# Patient Record
Sex: Female | Born: 1980 | Race: White | Hispanic: No | Marital: Single | Smoking: Never smoker
Health system: Southern US, Community
[De-identification: ages and names within clinical notes are randomized; demographics above are authoritative.]

## PROBLEM LIST (undated history)

## (undated) DIAGNOSIS — F445 Conversion disorder with seizures or convulsions: Secondary | ICD-10-CM

## (undated) DIAGNOSIS — Z974 Presence of external hearing-aid: Secondary | ICD-10-CM

## (undated) DIAGNOSIS — H9191 Unspecified hearing loss, right ear: Secondary | ICD-10-CM

## (undated) DIAGNOSIS — R569 Unspecified convulsions: Secondary | ICD-10-CM

---

## 2018-02-10 ENCOUNTER — Encounter: Payer: Self-pay | Admitting: NEUROLOGY

## 2018-02-10 ENCOUNTER — Ambulatory Visit: Payer: MEDICAID | Attending: NEUROLOGY | Admitting: NEUROLOGY

## 2018-02-10 VITALS — BP 119/87 | HR 86 | Temp 98.0°F | Resp 16 | Ht 62.0 in | Wt 120.4 lb

## 2018-02-10 DIAGNOSIS — G40309 Generalized idiopathic epilepsy and epileptic syndromes, not intractable, without status epilepticus: Principal | ICD-10-CM | POA: Insufficient documentation

## 2018-02-10 DIAGNOSIS — F410 Panic disorder [episodic paroxysmal anxiety] without agoraphobia: Secondary | ICD-10-CM | POA: Insufficient documentation

## 2018-02-10 NOTE — Nursing Note (Signed)
Vital signs taken, allergies verified, screened for pain.     Samiah Ricklefs, Avila Barron MA

## 2018-02-11 NOTE — Communication Body (Signed)
Lisa Hinton  [address]  [place] [address]    Dear Dr. Luis Abed, Lisa Gavia    Your patient, Lisa Hinton is a 37yr old female was seen today at the [facility] for possible facial pain trigeminal neuralgia. Marland Kitchen     HISTORY OF PRESENT ILLNESS:    The patient and her husband state that actually her problem is not trigeminal neuralgia but that prior referrals for pseudoseizures  had been previously turned down by Neurology at [facility] and [facility].    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  Neither she nor the spouse are aware as to whether this was sudden or not,  but the husband recalls that the patient had a lot of ear infections and was given speech training in school.  She was in special education because of her hearing deficit but did complete high school    5 years ago while under a lot of stress for legal issues contesting at which child custody she developed shaking kicks in her mouth and face went to the [facility] emergency room and had a workup that demonstrated a degenerative disc of her neck and EEG that was reportedly normal during the shaking episodes she slowly recovered with tearing on the left side of her face and weakness on the left side of her face using a walker she gradually recovered.    More recently in the past months in the setting of significant home stress with 4 children from a prior marriage,  she has episodes every 3 days where she developed facial tics that involve eye blinking twitching of the corner of her mouth confusion unable to walk with a recovery for brief episodes within hours and others that take a day or so her spouse notes that she complains occasionally of occipital headache sometimes this occurs during the  episodes sometimes this occurs after the episodes and she attributes it to neck pain.     Pt denies photophobia, phonophobia, N or V with occipital headaches-- denies visual scotomata     REVIEW OF SYSTEMS:  ROS    PAST MEDICAL HISTORY:   No past medical history on file.    MEDICATIONS:   No current outpatient medications on file prior to visit.     No current facility-administered medications on file prior to visit.        ALLERGIES:   No Known Allergies    FAMILY HISTORY:     EXAMINATION:     Neuro Exam:  Vitals:   BP 119/87 (SITE: left arm, Orthostatic Position: sitting, Cuff Size: regular)   Pulse 86   Temp 36.7 C (98 F) (Tympanic)   Resp 16   Ht 1.575 m ( )   Wt 54.6 kg (120 lb 5.9 oz)   BMI 22.02 kg/m     Mental Status:  Oriented to person and place,     Cranial Nerves:poor hearing using left ear and lip reading. Speech c/w early hearing loss, no facial weakness re eye lid closure, incomplete but symmetric smile    Gait:   Elaborated with knees together when walking but distractible to normal gait with slumped posture  general  tandem   Balance (ie Rhomberg, vestibular, cerebellar etc)     Coordination:  normal below  F-to-N:    H-to-S:   Rapid-alt movements    Reflexes: 2+, symmetric throughout    Motor (other)-- no tremor, no dystonia  Pyramidal   Extrapyramidal    Sensory: intact vibration and temperature of fork  Temperature:  Pinprick :   Vibration:      Motor:  Tone and bulk: thin, generalized deconditioning      DIAGNOSTIC STUDIES:     MRI brain 10/26/2017 ([place] [address]-- read only no CD, note patient had seizure during the MRI per the technologist): "few tiny nonspecific subcortical cerebral white matter changes bilaterally",  -- report uploaded into EMR-- no evidence of mass lesion or findings c/w demyelinative disease    MRI Cervical Spine 11/13/2017  ([place] [address] [phone])--  straightening  Of  cervical lordosis, mild broad-based posterior disk bulge  vs disc protusioon at C6-7- indents thecal sac with moderate central stenosisi  9mm  T2 signal intensity with superficial subcutaneous fat of posterior neck at C6 c/w sebaceous cyst    SUMMARY & IMPRESSION:     Pt had several events in the room consistent of twitching of left face, right corner of mouth, blinking of left, right or both eyes and trembling-- not simultaneously, no alteration of consciousness or tone    IMP: Non-epileptic seizures, no underlying neurological disorder detected on neurological examination    PLAN: Obtain CDs of MRI of head and neck for review at [facility] but radiological reads were thorough    A significant amount of time was spent re: NES and treatment options.        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     No orders of the defined types were placed in this encounter.    No further neurological evaluation necessary at this time but review of MRI imaging for completeness only    60  minutes were spent with patient, more than 50% of which was spent counseling and/or coordinating care on the disorder, diagnostic evaluation and treatment options.    Thank you very much for the referral    Sincerely yours      Electronically signed  Horald Pollen MD PhD  Professor and Chair  Department  of Neurology   [place]   Note: this chart was created in part with Dragon voice-recognition software. Every effort has been made to correct any errors in the voice-recognition / dictation, but some may have been missed. If there are any questions, please contact the author for clarification and revision if needed.

## 2018-02-11 NOTE — Progress Notes (Signed)
Lisa Hinton  [address]  [place]  [place] [address]    Dear Dr. Ahlers, Lisa Kinsella    Your patient, Lisa Hinton is a 37yr old female was seen today at the [facility] for possible facial pain trigeminal neuralgia. .     HISTORY OF PRESENT ILLNESS:    The patient and her husband state that actually her problem is not trigeminal neuralgia but that prior referrals for pseudoseizures  had been previously turned down by Neurology at [facility].    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  Neither she nor the spouse are aware as to whether this was sudden or not,  but the husband recalls that the patient had a lot of ear infections and was given speech training in school.  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    More recently in the past months in the setting of significant home stress with 4 children from a prior marriage,  she has episodes every 3 days where she developed facial tics that involve eye blinking twitching of the corner of her mouth confusion unable to walk with a recovery for brief episodes within hours and others that take a day or so her spouse notes that she complains occasionally of occipital headache sometimes this occurs during the  episodes sometimes this occurs after the episodes and she attributes it to neck pain.     Pt denies photophobia, phonophobia, N or V with occipital headaches-- denies visual scotomata     REVIEW OF SYSTEMS:  ROS    PAST MEDICAL HISTORY:   No past medical history on file.    MEDICATIONS:   No current outpatient medications on file prior to visit.     No current facility-administered medications on file prior to visit.        ALLERGIES:   No Known Allergies    FAMILY HISTORY:     EXAMINATION:     Neuro Exam:  Vitals:   BP 119/87 (SITE: left arm, Orthostatic Position: sitting, Cuff Size: regular)   Pulse 86   Temp 36.7 C (98 F) (Tympanic)   Resp 16   Ht 1.575 m (5' 2")   Wt 54.6 kg (120 lb 5.9 oz)   BMI 22.02 kg/m     Mental Status:  Oriented to person and place,     Cranial Nerves:poor hearing using left ear and lip reading. Speech c/w early hearing loss, no facial weakness re eye lid closure, incomplete but symmetric smile    Gait:   Elaborated with knees together when walking but distractible to normal gait with slumped posture  general  tandem   Balance (ie Rhomberg, vestibular, cerebellar etc)     Coordination:   normal below  F-to-N:    H-to-S:   Rapid-alt movements    Reflexes: 2+, symmetric throughout    Motor (other)-- no tremor, no dystonia  Pyramidal   Extrapyramidal    Sensory: intact vibration and temperature of fork  Temperature:  Pinprick :   Vibration:      Motor:  Tone and bulk: thin, generalized deconditioning      DIAGNOSTIC STUDIES:     MRI brain 10/26/2017 ([place] [address]-- read only no CD, note patient had seizure during the MRI per the technologist): "few tiny nonspecific subcortical cerebral white matter changes bilaterally",  -- report uploaded into EMR-- no evidence of mass lesion or findings c/w demyelinative disease    MRI Cervical Spine 11/13/2017  ([place] [address] [phone])--  straightening  Of  cervical lordosis, mild broad-based posterior disk bulge  vs disc protusioon at C6-7- indents thecal sac with moderate central stenosisi  9mm  T2 signal intensity with superficial subcutaneous fat of posterior neck at C6 c/w sebaceous cyst    SUMMARY & IMPRESSION:     Pt had several events in the room consistent of twitching of left face, right corner of mouth, blinking of left, right or both eyes and trembling-- not simultaneously, no alteration of consciousness or tone    IMP: Non-epileptic seizures, no underlying neurological disorder detected on neurological examination    PLAN: Obtain CDs of MRI of head and neck for review at [facility] but radiological reads were thorough    A significant amount of time was spent re: NES and treatment options.        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     No orders of the defined types were placed in this encounter.    No further neurological evaluation necessary at this time but review of MRI imaging for completeness only    60  minutes were spent with patient, more than 50% of which was spent counseling and/or coordinating care on the disorder, diagnostic evaluation and treatment options.    Thank you very much for the referral    Sincerely yours      Electronically signed  Horald Pollen MD PhD  Professor and Chair  Department  of Neurology   [place]   Note: this chart was created in part with Dragon voice-recognition software. Every effort has been made to correct any errors in the voice-recognition / dictation, but some may have been missed. If there are any questions, please contact the author for clarification and revision if needed.

## 2018-06-18 ENCOUNTER — Encounter: Payer: Self-pay | Admitting: NEUROLOGY

## 2018-06-18 NOTE — Progress Notes (Signed)
Contacted patient for scheduling, per husband Lisa Hinton patient is already being seen with another neurologist and doesn't not want to schedule any upcoming appointments.          Laury Axon  Neuroscience [facility]  Appointment line:  [phone]  Direct line: [phone]

## 2018-08-09 ENCOUNTER — Inpatient Hospital Stay
Admission: RE | Admit: 2018-08-09 | Discharge: 2018-08-11 | DRG: 880 | Disposition: A | Discharge status: Home / Self Care | Payer: MEDICAID | Source: Ambulatory Visit | Attending: CLN NEUROPHYSIOLOGY | Admitting: CLN NEUROPHYSIOLOGY

## 2018-08-09 DIAGNOSIS — G43909 Migraine, unspecified, not intractable, without status migrainosus: Secondary | ICD-10-CM | POA: Diagnosis present

## 2018-08-09 DIAGNOSIS — R253 Fasciculation: Secondary | ICD-10-CM

## 2018-08-09 DIAGNOSIS — R250 Abnormal head movements: Secondary | ICD-10-CM

## 2018-08-09 DIAGNOSIS — R258 Other abnormal involuntary movements: Secondary | ICD-10-CM

## 2018-08-09 DIAGNOSIS — F445 Conversion disorder with seizures or convulsions: Principal | ICD-10-CM | POA: Diagnosis present

## 2018-08-09 DIAGNOSIS — Z79899 Other long term (current) drug therapy: Secondary | ICD-10-CM

## 2018-08-09 DIAGNOSIS — H908 Mixed conductive and sensorineural hearing loss, unspecified: Secondary | ICD-10-CM | POA: Diagnosis present

## 2018-08-09 DIAGNOSIS — Z87898 Personal history of other specified conditions: Secondary | ICD-10-CM

## 2018-08-09 DIAGNOSIS — R6889 Other general symptoms and signs: Secondary | ICD-10-CM

## 2018-08-09 MED ORDER — AMITRIPTYLINE 10 MG TABLET
10.0000 mg | ORAL_TABLET | Freq: Every day | ORAL | Status: DC
Start: 2018-08-10 — End: 2018-08-09
  Filled 2018-08-09: qty 1, fill #0

## 2018-08-09 MED ORDER — DOCUSATE SODIUM 100 MG CAPSULE
100.0000 mg | ORAL_CAPSULE | Freq: Two times a day (BID) | ORAL | Status: DC
Start: 2018-08-09 — End: 2018-08-11

## 2018-08-09 MED ORDER — ENOXAPARIN 40 MG/0.4 ML SUBCUTANEOUS SYRINGE
40.0000 mg | INJECTION | Freq: Every day | SUBCUTANEOUS | Status: DC
Start: 2018-08-09 — End: 2018-08-11
  Administered 2018-08-09 – 2018-08-10 (×2): 40 mg via SUBCUTANEOUS
  Filled 2018-08-09 (×2): qty 0.4, fill #0

## 2018-08-09 MED ORDER — ACETAMINOPHEN 325 MG TABLET
650.0000 mg | ORAL_TABLET | ORAL | Status: DC | PRN
Start: 2018-08-09 — End: 2018-08-09

## 2018-08-09 MED ORDER — ACETAMINOPHEN 325 MG TABLET
650.0000 mg | ORAL_TABLET | ORAL | Status: DC | PRN
Start: 2018-08-09 — End: 2018-08-11
  Administered 2018-08-09 – 2018-08-10 (×4): 650 mg via ORAL
  Filled 2018-08-09 (×4): qty 2, fill #0

## 2018-08-09 MED ORDER — AMITRIPTYLINE 10 MG TABLET
20.0000 mg | ORAL_TABLET | Freq: Every day | ORAL | Status: DC
Start: 2018-08-10 — End: 2018-08-11
  Administered 2018-08-10: 20 mg via ORAL
  Filled 2018-08-09 (×2): qty 2, fill #0

## 2018-08-09 MED ORDER — LORAZEPAM 2 MG/ML INJECTION SOLUTION
2.0000 mg | INTRAMUSCULAR | Status: DC | PRN
Start: 2018-08-09 — End: 2018-08-11

## 2018-08-09 NOTE — Nurse Focus (Signed)
2232 H : Seizure alarm on and upon entering room, pt positioned to the R with neck extended and noted some R facial twitching, eye fluttering w/ eyes rolled up, hand - fingers twitching R > L and bilateral feet - toes twitching R > L. HR on the 80's, satting great at 98-99% on RA.  2235 H : Noted hand twitching more pronounced R > L side. No incontinence during the episode. During this event, pt was non verbal but pt able to follow some commands like hand grasp, showing 2 fingers, thumbs up. Episode prob lasted 12-15 mins.  2246 H : Event ended. Post event, pt states she's very tired, out for words, HR 80's, sating great at 98% on RA. 2300'ish - pt appears to be back to baseline, conversant. Continue w/ seizure prec, supplemental O2 via FM w/ RB adm PRN, suction ready - standby. Will cont to monitor.    2248 H : Pt again w/ neck extended started to have eye fluttering, able to tell and point to RN  to her tongue rolled  Back. Some eye fluttering, fingers twitching R > L, feet - toes twitching as well R > L . Pt able to lift R arm up to command, hand grasp. HR 90 bpm sating great at 99% on RA. Episode ended at approx 2257 H. Post event, pt states she's not able to verbally respond but pt able tp point out that her neck hurts. 2310'ish - pt more conversant, appears to be back to  Baseline. Will cont to monitor.    NRO notified w/ this events - no new orders received. Will cont to monitor.    2351 H: RN at [facility] and noted feet - toes twitching and R hand - fingers twitching - seizure alarm pushed. Pt alert and conversant, HR 80's sating great at 99% on RA. Pt able to wipe her face and cary on conversation during this event. Episode prob lasted 2-3 mins.  Will cont to monitor. Continue w/ seizure prec.      Anson Fret, RN

## 2018-08-09 NOTE — H&P (Addendum)
NEUROLOGY RESIDENT PGY-2 VET ADMISSION H&P    PATIENT:  Lisa Hinton  MRN:         [id number]    NOTE DATE / TIME:  08/09/2018  @ 16:33    CHIEF COMPLAINT:  Patient is directly admitted for video EEG telemetry monitoring.    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 She still has balance issues from the stroke she says she had. 3 weeks after, she notes that she started to have seizures. She would have random episodes of left face tingling +/- left hand tingling followed by bilateral face twitching L>R, bilateral eyelid twitching, eyes roll upwards, bilateral hand twitching, and bilateral feet twitching. This would be followed by 2 days of fatigue and word finding difficulty. Other than word finding difficulty, no confusion. No urinary incontinence of tongue biting, although she notes that recent episodes her tongue will roll upwards. Head does not turn, she does not stiffen. She is unsure if she has ever had violent convulsions before, but there were times were her husband needed to hold her down due to fear of falling. She knows her episodes are about to come when she has tingling in the left side of her face. She started taking Keppra 750mg  BID per tele neurologist, but since then her episodes have become more frequent, and she notices that after about 1 hour after taking dose she has an episode. Seizure trigger includes bright lights and spicy foods. Otherwise she does not notice any other triggers or patterns to her seizure.  No nausea or vomiting with her symptoms, or visual hallucinations. Right now, she has several episodes a day, too many to count. Each episode can last anywhere from 1 minute to 1 whole hour. Sometimes the seizure would awaken her from her sleep. Sometimes they would happen in the middle of the night while she is awake.    She has no history of head trauma. She reports that she was born preterm, about 6 weeks early. 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 She had a rEEG performed in the past, unsure of results. 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 She also reports that her CMV and EBV viral levels are very high (per OSH records, she is referring to IgG elevated titers which are expected to be elevated in someone with prior infectious mononucleosis and prior CMV infection).    She reports she has a history of migraine headaches, gets migraines about 3x a week, no auras associated with migraine, takes advil which helps. Recently started amitriptiline. Sometimes her episodes follow her migraines, but her episodes happen most of the time without migraines.    She has never been on AEDs other than Keppra which was started in  August.    She denies any stressors in her life.    Per OSH hospital records, she had normal routine EEG study 6 years ago, normalcy of awake and drowsy EEG performed 04/29/2018. Told outside provider having 12 episodes a week, put on Keppra 750mg  BID as a trial.      REVIEW OF SYSTEMS:  A ROS was performed and is negative except for those items noted in the HPI    PAST MEDICAL & SURGICAL HISTORY:  Migraines  Whole body cysts  Trigeminal neuralgia  High CMV levels  High EBV levels    MEDICATIONS, OUTPATIENT:  Elavil 20mg  daily morning  Keppra 750mg  BID     ALLERGIES:  Gets  yeast infections from antibiotics    FAMILY HISTORY:  4 children, eldest 53 years old, all healthy, no history of neurological disorders  Parents healthy, no history of neurological disorders  Has brothers and sisters, no history of neurological disorders    SOCIAL HISTORY:   No smoking alcohol or drugs  Lives with her husband and 4 kids  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  Does patient drive?:  Yes, has active license  Special education classes whole life but has high school degree    EXAMINATION:    Weight:       Vitals:     Current  Minimum Maximum   BP    BP: --   Temp    No Data Recorded   No Data Recorded     Pulse   No Data Recorded   No Data Recorded     Resp   No Data Recorded   No Data Recorded     O2 Sat   No Data Recorded  No Data Recorded    O2 Deliv None ; No Data Recorded      24 Hour Intake/Output:  No intake/output data recorded.    General Physical Exam:  General:  Well appearing female, lying in bed, anxious appearing  HEENT:  NCAT, EEG leads on  Respiratory: No distress  Skin:  No rash    Neuro Exam:    Mental Status:    State:  Alert and awake, socially appropriate, slightly anxious, tangential  Use of language:  Appropriate, no receptive or expressive difficulties  Speech: Fluent and coherent without paraphrases    Cranial Nerves:  CN 2:        PERRL, VF intact to finger count  CN 3,4,6:  EOMI without nystagmus or abnormal saccades.  CN 5:        Sensation to LT intact.  CN 7:        Facial symmetric.  CN 8:        Hearing intact to loud voice.  CN 9, 10:  Palate elevation symmetric  CN 11:     Trapezius strength 5/5 bilaterally.  CN 12:      Tongue protrusion midline.    Motor:  Tone and bulk:  Normal    Pronator drift:  None      Deltoid Biceps Triceps Wrist Flexors Wrist Extensors Grip   R 5 5 5 5 5 5    L 5 5 5 5 5 5       Hip Flexors Leg Abduct Knee Flexors Knee Ext Dorsi- Flexion Plantar- Flexion   R 5 5 5 5 5 5    L 5 5 5 5 5 5      Reflexes:     Biceps-C5 BR-C6 Triceps-C7 Patellar-L4  Achilles-S1 Plantar  Resp   R 2+ 2+ 2+ 2+ 2+ down   L 2+ 2+ 2+ 2+ 2+ down     Sensory:  Light touch: intact throughout    Coordination:  F-to-N:  No ataxia    General gait:  Normal    DIAGNOSTIC STUDIES:    EEG:   rEEG 04/29/2018  This recording is severely technically limited secondary to frequent motion, muscle, and eye flutter artifacts. Readable segments demonstrate a posterior dominant rhythm in the 9-10 hz range, which is symmetric. Low amplitude beta activity is seen in the anterior and central head region symmetrically. Drowsiness and other sleep stages not appreciated. Hyperventilation and photic stimulation were not performed. This routine EEG is severely technically limited secondary to very frequent motion, muscle, and eye flutter artifact. Readable segments were WNL. No definite focal nor irritative features were seen. If clinically indicated, we would repeat study under better conditions.    MRI:    MRI Cervical Spine without Contrast 11/13/2017  There is straightening of the cervical lordosis. There is a mild broad-based posterior disc bulge versus disc protrusion at C6-7. This indents the ventral aspect of the thecal asac and reuslts in moderate central stenosis as it combines with straightening of the cervical lordosis. There is moderate central stenosis from C3-4 through C6-7 as described above and mild central stenosis at C2-3. There is an approximate 9mm increased T2 signal intensity within the superficial subcutaneous fat at the poster neck at the level of C6. The exact etiology of this is unclear although this may be related to sebaceous cyst. Clinical correlation is needed.    MRI Brain without Contrast 04/25/2018  There are a few tiny nonspecific subcortical cerebral white matter changes bilaterally. These do not have a characteristic appearance or distribution to suggest a particular etiology. They are abnormal for age. Possible causes include vasculitis and Lyme disease. Demyelinating  disease is not entirely excluded given patient's age. No apparent intracranial mass is seen within the limits of a noncontrast exam.    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    PLAN:   1.  Admit to Neurology for video EEG telemetry monitoring.  2.  Plan to decrease current AEDs:  Discontinue Keppra  3.  Seizure precautions / padded bed rails.  4.  Door to room is to always remain open, and sitter must be present 24 hours a day.  5.  Continuous pulse oximetry with alarm set at 85% saturation or less.   6.  RN to notify team for generalized convulsive seizure lasting more than 2 minutes,        two or more convulsive seizures within a 60 minute period, or two or more seizures without complete recovery of consciousness beween seizures.  7.  Ativan 1mg  is to be given for generalized convulsive seizures lasting more than 2 minutes, two or more generalized convulsive seizures within any 8 hour period, or three or more seizures of any duration accompanied by alteration of consciousness in any 12 hour period.    F/E/N:  Regular Diet  DVT/GI PROPHYLAXIS:  Lovenox  CODE STATUS:  Full    PRESENT ON ADMISSION:  Are any of the following five conditions present or suspected on admission: decubitus ulcer, infection from an intravascular device, infection due to an indwelling catheter, surgical site infection, or pneumonia? No.  Report Electronically Signed By:  Fernand Parkins, DO (Resident)  Department of Neurology, Pager: [phone]      ATTENDING ADDENDUM:  The patient was seen and evaluated with Dr. Cherly Hensen.  I reviewed and agree with the resident's assessment and plan we developed as outlined in the note with the exception of any addendum noted below:    #1 Spells of abnormal shaking of unclear etiology.   Ms. Storey is being admitted for spell  classification She has had spells for several years of bilateral abnormal movement with retained awareness. She was placed on Keppra by her prior neurologist as a trial but noticed that this did not help her spells. She did not take her Keppra this morning. We will stop Keppra today to try to induce spells. Additionally, she can try spicy food and sleep deprivation to trigger events. As these spells occur multiple times per day, we will try to monitor her for several typical spells. She has no history of a convulsive seizure. . Suspicion is high that these events are nonepileptic spells.    Wynetta Fines, M.D.  Assistant Clinical Professor  Department of Neurology - Epilepsy

## 2018-08-09 NOTE — Nurse Assessment (Signed)
ASSESSMENT NOTE    Note Started: 08/09/2018, 23:32     Initial assessment completed and recorded in EMR.  Report received from day shift nurse and orders reviewed. Plan of Care reviewed and updated, discussed with patient and family.    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    2000: Pt appears sleepy this time. O x 4 but speech is slow and somehow some word finding difficulty. Per Fredia Sorrow who is at [facility] states that "this is typical of her after events, she's tired and slow and out for words". PERRLA, grossly tracks, FS, TM. With gen weakness, MAE x 4, tolerates OOb to BR w/ 1 person assist and voids spont to yellow urine. Had a BM. Tolerates diet. Pt able to reposition self in bed.    On tele, checked/w/ D6 tele tech Thayer Ohm and pt appears to be on SR.     On EEG, all EEG leads appear intact, monitors on, alarms on and audible. Suction and O2 ready - standby. Checked w/ on call EEG lab tech that pt is not on Co2 monitor - states she'll be connected in the morning.    Mom Renea Ee at [facility] as sitter, attentive to pt.    Reinforced seizure prec and protocol.    Pt assisted in needs. Will cont to monitor.    Anson Fret, RN

## 2018-08-09 NOTE — Nurse Assessment (Signed)
ADMIT NURSING NOTE    Note Started: 08/09/2018, 19:24     Patient admitted at 1633 hours EEGlab and accompanied by EEG tech and pt's parents. Pt condition stable . patient and family oriented to room and unit. Admission Assessment and Plan of Care initiated.  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    Pt A&Ox4, mother at bedside, pt connected to tele and EEG monitoring, PIV inserted in Left AC. Pt ate dinner. 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    Pt had 2 events during which she had bilateral eyelid twitching/flattering, bilateral hand and feet twitching, lip smacking, no convulsions or stiffening, responsive with word finding difficulties.    Reports given to night shift nurse.     Lilly Cove,  RN

## 2018-08-10 ENCOUNTER — Telehealth: Payer: Self-pay | Admitting: CLN NEUROPHYSIOLOGY

## 2018-08-10 DIAGNOSIS — G43909 Migraine, unspecified, not intractable, without status migrainosus: Secondary | ICD-10-CM

## 2018-08-10 DIAGNOSIS — H908 Mixed conductive and sensorineural hearing loss, unspecified: Secondary | ICD-10-CM

## 2018-08-10 LAB — C DIFFICILE SURVEILLANCE TEST: TEST RESULT 2: NEGATIVE

## 2018-08-10 LAB — CULTURE SURVEILLANCE, MRSA

## 2018-08-10 MED ORDER — CYCLOBENZAPRINE 10 MG TABLET
5.0000 mg | ORAL_TABLET | Freq: Three times a day (TID) | ORAL | Status: DC | PRN
Start: 2018-08-10 — End: 2018-08-11
  Administered 2018-08-10: 5 mg via ORAL
  Filled 2018-08-10: qty 1, fill #0

## 2018-08-10 MED ORDER — IBUPROFEN 400 MG TABLET
400.0000 mg | ORAL_TABLET | Freq: Four times a day (QID) | ORAL | Status: DC | PRN
Start: 2018-08-10 — End: 2018-08-11
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
## 2018-08-10 NOTE — Nurse Assessment (Signed)
ASSESSMENT NOTE    Note Started: 08/10/2018, 23:36     Initial assessment completed and recorded in EMR.  Report received from day shift nurse and orders reviewed. Pt AAOx4, video EEG in use, telemetry monitor in place, VSS, family at bedside. Alarms active and audible. Plan of Care reviewed and appropriate, discussed with patient and family.  Vonna Kotyk, RN

## 2018-08-10 NOTE — Plan of Care (Signed)
Problem: Patient Care Overview  Goal: Plan of Care Review  Outcome: Ongoing (interventions implemented as appropriate)  Flowsheets (Taken 08/10/2018 0432)  Outcome Summary: No neuro changes from baseline. VSS. SR on tele. Medicated 1x w/ po tylenol w/ adeq relief. Up to BR w/ 1 person assist, regular B/B. Pt able to reposition self in bed. Has some events during the night - pls see focus note. NRO notified of events - no new orders received. All EEG leads appear intact, monitors on, alarms on and audible. Mom Renea Ee at [facility] and stayed overnight - attentive to pt. Seizure prec maintained. Sleeping in b/n rounds / checks. Pt free from injury.  Goal: Individualization and Mutuality  Outcome: Ongoing (interventions implemented as appropriate)  Goal: Discharge Needs Assessment  Outcome: Ongoing (interventions implemented as appropriate)  Goal: Interprofessional Rounds/Family Conf  Outcome: Ongoing (interventions implemented as appropriate)     Problem: Fall Risk (Adult)  Goal: Identify Related Risk Factors and Signs and Symptoms  Description  Related risk factors and signs and symptoms are identified upon initiation of Human Response Clinical Practice Guideline (CPG).  Outcome: Ongoing (interventions implemented as appropriate)  Goal: Absence of Fall  Description  Patient will demonstrate the desired outcomes by discharge/transition of care.  Outcome: Ongoing (interventions implemented as appropriate)     Problem: Seizure Disorder/Epilepsy (Adult)  Goal: Signs and Symptoms of Listed Potential Problems Will be Absent, Minimized or Managed (Seizure Disorder/Epilepsy)  Description  Signs and symptoms of listed potential problems will be absent, minimized or managed by discharge/transition of care (reference Seizure Disorder/Epilepsy (Adult) CPG).  Outcome: Ongoing (interventions implemented as appropriate)

## 2018-08-10 NOTE — Nurse Assessment (Signed)
ASSESSMENT NOTE    Note Started: 08/10/2018, 10:36     Initial assessment completed and recorded in EMR.  Report received from night shift nurse and orders reviewed. Plan of Care reviewed and appropriate, discussed with patient. Pt oriented x 4, moves all extremities, family at bedside, ate with good appetite, food tolerated well,  Elisha Headland, RN RN

## 2018-08-10 NOTE — Telephone Encounter (Signed)
Received a call from the patient's spouse requesting a call back to discuss patient's VET admission and diagnosis.         Ashok Cordia Luca Dyar   Neurophysiology [facility]  Phone: [phone]

## 2018-08-10 NOTE — Plan of Care (Addendum)
Pt neurostatus remain stable, pt up and walk to the BR with supervision, with steady gait. , ate with good appetite, food tolerated well,  EEG lead intact, with padded side rails,  cardiac tele reported HR up to 140s  reported to Dr Cherly Hensen, no new order noted. pt had episodes 747am,755am,1414, of facial twitching, lip smacking, eye flickering, and twitching of both hands and legs, lasted for 1-2 mins, pt was oriented after, kept skin clean and dry, no pressure ulcer noted.  1558 pt had generalized body shaking, repetitive movement, pt is drooling , 02 sat down to 89, HR-120s, pt stared  After. 02 sats given, pt turned to side, kept pt safe.  1600 pt had brief episode  Few second of shaking, pt drooling that time.  MD notified of the above.   Problem: Seizure Disorder/Epilepsy (Adult)  Goal: Signs and Symptoms of Listed Potential Problems Will be Absent, Minimized or Managed (Seizure Disorder/Epilepsy)  Description  Signs and symptoms of listed potential problems will be absent, minimized or managed by discharge/transition of care (reference Seizure Disorder/Epilepsy (Adult) CPG).  Outcome: Ongoing (interventions implemented as appropriate)     Problem: Fall Risk (Adult)  Goal: Absence of Fall  Description  Patient will demonstrate the desired outcomes by discharge/transition of care.  Outcome: Ongoing (interventions implemented as appropriate)     Problem: Patient Care Overview  Goal: Discharge Needs Assessment  Outcome: Ongoing (interventions implemented as appropriate)

## 2018-08-11 ENCOUNTER — Encounter: Payer: Self-pay | Admitting: CLN NEUROPHYSIOLOGY

## 2018-08-11 DIAGNOSIS — F445 Conversion disorder with seizures or convulsions: Principal | ICD-10-CM

## 2018-08-11 NOTE — Procedures (Signed)
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 She was not a candidate for rehab, so she trained herself to walk again. She still has balance issues from the stroke she says she had. 3 weeks later, she began having episodes.  She started taking Keppra 750mg  BID per tele neurologist, but since then her episodes have become more frequent, and she notices that after about 1 hour after taking dose she has an episode.    She has no history of head trauma. She reports that she was born preterm, about 6 weeks early. 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 She had a rEEG performed in the past, unsure of results. 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 She also reports that her CMV and EBV viral levels are very high (per OSH records, she is referring to IgG elevated titers which are expected to be elevated in someone with prior infectious mononucleosis and prior CMV infection).    She reports she has a history of migraine headaches, gets migraines about 3x a week, no auras associated with migraine,  takes advil which helps. Recently started amitriptiline. Sometimes her episodes follow her migraines, but her episodes happen most of the time without migraines.    She has never been on AEDs other than Keppra which was started in August.    She denies any stressors in her life.    Told outside provider having 12 episodes a week, put on Keppra 750mg  BID as a trial.    Event:   She would have random episodes of left face tingling +/- left hand tingling followed by bilateral face twitching L>R, bilateral eyelid twitching, eyes roll upwards, bilateral hand twitching, and bilateral feet twitching. This would be followed by 2 days of fatigue and word finding difficulty. Other than word finding difficulty, no confusion. No urinary incontinence of tongue biting, although she notes that recent episodes her tongue will roll upwards. Head does not turn, she does not stiffen. She is unsure if she has ever had violent convulsions before, but there were times were her husband needed to hold her down due to fear of falling. She knows her episodes are about to come when she has tingling in the left side of her face.   Seizure trigger includes bright lights and spicy foods. Otherwise she does not notice any other triggers or patterns to her seizure. No nausea or vomiting with her symptoms, or visual hallucinations. Right now, she has several episodes a day, too many to count. Each episode can last anywhere from 1  minute to 1 whole hour. Sometimes the seizure would occur after waking at night but would not wake her from sleep.     ANTIEPILEPTIC MEDICATIONS:  Keppra 750mg  BID    PREVIOUS INVESTIGATIONS:  Per OSH hospital records, she had normal routine EEG study 6 years ago.     EEG:   rEEG 04/29/2018  This recording is severely technically limited secondary to frequent motion, muscle, and eye flutter artifacts. Readable segments demonstrate a posterior dominant rhythm in the 9-10 hz range, which is symmetric. Low amplitude beta  activity is seen in the anterior and central head region symmetrically. Drowsiness and other sleep stages not appreciated. Hyperventilation and photic stimulation were not performed. This routine EEG is severely technically limited secondary to very frequent motion, muscle, and eye flutter artifact. Readable segments were WNL. No definite focal nor irritative features were seen. If clinically indicated, we would repeat study under better conditions.    MRI:    MRI Cervical Spine without Contrast 11/13/2017  There is straightening of the cervical lordosis. There is a mild broad-based posterior disc bulge versus disc protrusion at C6-7. This indents the ventral aspect of the thecal asac and reuslts in moderate central stenosis as it combines with straightening of the cervical lordosis. There is moderate central stenosis from C3-4 through C6-7 as described above and mild central stenosis at C2-3. There is an approximate 9mm increased T2 signal intensity within the superficial subcutaneous fat at the poster neck at the level of C6. The exact etiology of this is unclear although this may be related to sebaceous cyst. Clinical correlation is needed.    MRI Brain without Contrast 04/25/2018  There are a few tiny nonspecific subcortical cerebral white matter changes bilaterally. These do not have a characteristic appearance or distribution to suggest a particular etiology. They are abnormal for age. Possible causes include vasculitis and Lyme disease. Demyelinating disease is not entirely excluded given patient's age. No apparent intracranial mass is seen within the limits of a noncontrast exam.    DESCRIPTION OF PROCEDURE:  EEG was recorded from the ACNS standard scalp 10-20 locations as well as the T1 and T2 electrodes and bilateral mandibular notch electrodes.  Nasal airflow and abdominal excursions, three-channel EKG, including leads II, V1 and V5, oxygen saturation using digital pulse oximetry and end-tidal CO2 were  recorded throughout this study and synchronized with the video EEG recording.     INTERICTAL DESCRIPTION:  Interictal Non-Epileptiform Abnormalities  There was symmetric, low amplitude 9.5 Hz alpha activity present over the posterior head regions.     In sleep, there were symmetric V waves, K complexes and sleep spindles.    Interictal Epileptiform Abnormalities  None      ICTAL DESCRIPTION:  There were no recorded seizures.     Event descriptions:   The first event occurred prior to the patient being placed on EEG but appeared clinically similar to the below events.     Event #1, 2, and 3 (10:33 PM, 10:41 PM, 10:48 PM on 08/09/2018)  Clinical Description: The patient displayed restlessness, left hand twitching, left finger tapping, eyelid fluttering, upward eye rolling and intermittent eyelid closure, left toe tapping, lip smacking, left lower facial twitching that went on to involve right lower facial twitching, right hand twitching and right finger tapping, right toe tapping and unresponsiveness to questions and commands. The events waxed and waned with termination at 10:53 (Total duration approximately 20 minutes). After the event, she was immediately responsive to orientation questions. There  was no change in heart rate during the event. This was a typical event.  EEG Description: A normal 9-10 Hz posterior dominant rhythm was present throughout the recording. There was no post-event slowing. There were no electrographic epileptiform abnormalities during the event.    Event #4 (11:51 PM on 08/09/2018)  Clinical Description: Identical to the above. Right finger tapping, restlessness, and direction changing eye movements were seen during the event. It lasted 3 minutes in duration. There was no change in heart rate. This was a typical event.  EEG Description: A normal posterior dominant rhythm was present throughout the recording. There was no post-event slowing. There were no electrographic epileptiform  abnormalities during the event.    Event #5 (07:43 AM on 08/10/2018)  Clinical Description: The patient's eyes rolled upwards, she had right lower facial pulling, left hand tapping and occasional hand jerks. She had lip licking and would rub her hands together as well as intermittent left foot tapping. She was able to follow some commands and withdrew to pain. This lasted 15 minutes. This was a typical event.  EEG Description: No EEG correlate. Normal posterior dominant rhythm was visible at times during the event.     Event #6 and 7 (3:55 PM and 4:04 PM on 08/10/2018)  Clinical Description: The patient had a severe event of whole body trembling and high amplitude direction changing lateral head shaking and high amplitude hand shaking bilaterally. He had asymmetric, asynchronous arm and leg trembling and shaking. Her eyes rolled upwards at times, exhibited rapid eyelid fluttering or her eyes were closed. During the event, she had intermittant hand clapping and finger rubbing or tapping. She was unresponsive during the episode. This lasted 2 minutes with a pause at 3:57 PM and resumed at 4:04 PM until 4:07 PM (duration 3 minutes).   EEG Description: During the high amplitude shaking, the EEG was obscured and showed rhythmic myogenic artifact. Intermittently when shaking was less severe, a normal posterior dominant rhythm and mu rhythm were visible. Immediately prior to the episode and after the episode, there was no focal slowing, attenuation or other abnormal change. A normal posterior dominant rhythm was visible during before and immediately after the episode.     OTHER TESTING DURING ADMISSION:  None    IMPRESSION: Normal (awake and asleep).   Recorded 6 typical episodes of eyes rolling, unresponsiveness, lip smacking, left and right facial twitching, high amplitude and low amplitude limb shaking, finger and toe tapping bilaterally without EEG correlate.       ANTIEPILEPTIC MEDICATIONS ON DISCHARGE:  As the  patient was not accepting of a diagnosis of behavioral/psychogenic nonepileptic events and was hesitant to taper Keppra, we discharged her on Keppra 750 mg BID.     I spoke with her outpatient Neurologist who will plan to taper this in follow up.     Wynetta Fines, M.D.  Assistant Clinical Professor  Department of Neurology - Epilepsy

## 2018-08-11 NOTE — Progress Notes (Signed)
Letter from Dr. Jonni Sanger faxed to Dr. Elio Forget [phone], fax confirmation received.    Domenica Fail, RN  Neurology

## 2018-08-11 NOTE — Clinical Case Management (Signed)
Clinical Case Management Assessments    Name: Lisa Hinton  MRN: [id number]   Date of Birth: [birth year] (38yr) Gender: female    Note Date: 08/11/2018 Note Time: 12:28       FINAL DISPOSITION NOTE    Permanent Address: [address]  [place] [address]    Discharge Disposition: Home (or alternative private residence)         Patient discharged to:   Has facility accepted the patient?: N/A         Patient/patient representative, informed of discharge disposition: yes  Patient/family offered choice of provider and agreeable with plan/referrals? yes        Funding/Billing: Payor: [employer] / Plan: [employer] / Product Type: *No Product type* /      Patient can follow-up with:   PCP: Racheal Patches, MD  /  Phone Number: [phone]  Preferred [facility]acy:  [facility] [address] - [place], [place] - [address], [phone] [facility] [phone] Kallie Edward [phone] Dareen Piano, [place] - [address] [place], [phone] [facility] [phone] FX    Transportation:   Transportation needed?: No     Comments: Discharge to home with husband.    Date/Time:08/11/2018 12:28  Electronically Signed by:   Lockie Mola, Case Manager  Pager [phone]

## 2018-08-11 NOTE — Plan of Care (Signed)
Pt AAOx4, denies numbness/tingling. Medicated for headache, denies dizziness/weakness. Reported intermittent anxiety. Family remained at bedside, involved in care. Video EEG in use, telemetry monitor in place, patient assisted to bathroom. No events overnight. Fall precautions in place.       Problem: Patient Care Overview  Goal: Plan of Care Review  Outcome: Ongoing (interventions implemented as appropriate)  Goal: Discharge Needs Assessment  Outcome: Ongoing (interventions implemented as appropriate)     Problem: Fall Risk (Adult)  Goal: Identify Related Risk Factors and Signs and Symptoms  Description  Related risk factors and signs and symptoms are identified upon initiation of Human Response Clinical Practice Guideline (CPG).  Outcome: Ongoing (interventions implemented as appropriate)  Goal: Absence of Fall  Description  Patient will demonstrate the desired outcomes by discharge/transition of care.  Outcome: Ongoing (interventions implemented as appropriate)     Problem: Seizure Disorder/Epilepsy (Adult)  Goal: Signs and Symptoms of Listed Potential Problems Will be Absent, Minimized or Managed (Seizure Disorder/Epilepsy)  Description  Signs and symptoms of listed potential problems will be absent, minimized or managed by discharge/transition of care (reference Seizure Disorder/Epilepsy (Adult) CPG).  Outcome: Ongoing (interventions implemented as appropriate)

## 2018-08-11 NOTE — Discharge Planning (AHS/AVS) (Signed)
Here are some community resources should you need them in the future:    If you have questions about your private medical insurance including [employer] plans:  Call the customer service number located on your insurance card.    Community Resources [place]  www.[place]hs[place]      Medi[place]Cal  [phone] For [place] only   For all other counties, refer to website  www.dhs.[place].gov/services/medi-cal    If you have any further questions related to this matter, you can contact Clinical Case Management at [phone].

## 2018-08-11 NOTE — Procedures (Deleted)
[place] NEUROLOGY  VIDEO EEG TELEMETRY  EPILEPSY MONITORING UNIT REPORT    MR #: [id number]  DOB: December 31, 1980  GENDER: female AGE: 6yr  STUDY NUMBER: [id number]  STUDY START DATE & TIME: 08/09/2018 at 18:42  STUDY STOP DATE & TIME: 08/11/2018 at 12:06  ORDERING PHYSICIAN: Gretta Cool  TECHNOLOGIST: Dollene Cleveland  EEG SYSTEM: Nicolet    HISTORY:  37yr old female with a history of       CURRENT SEIZURE/EVENT DESCRIPTION(S) AND FREQUENCY:      ANTIEPILEPTIC MEDICATIONS:      PREVIOUS INVESTIGATIONS:      DESCRIPTION OF PROCEDURE:  EEG was recorded from the ACNS standard scalp 10-20 locations as well as the T1 and T2 electrodes and bilateral mandibular notch electrodes.  Nasal airflow and abdominal excursions, three-channel EKG, including leads II, V1 and V5, oxygen saturation using digital pulse oximetry and end-tidal CO2 were recorded throughout this study and synchronized with the video EEG recording     INTERICTAL DESCRIPTION:      Interictal Non-Epileptiform Abnormalities      Interictal Epileptiform Abnormalities      ICTAL DESCRIPTION:    Event #1 ( on //2019)  Clinical Description:   EEG Description:       Event/Seizure #1 ( on //2019)  Clinical Description:   EEG Description:     Event/Seizure #2 ( on //2019)  Clinical Description:   EEG Description:     Event/Seizure #3 ( on //2019)  Clinical Description:   EEG Description:     Event/Seizure #4 ( on //2019)  Clinical Description:   EEG Description:     Event/Seizure #5 ( on //2019)  Clinical Description:   EEG Description:       OTHER TESTING DURING ADMISSION:      IMPRESSION:      ANTIEPILEPTIC MEDICATIONS ON DISCHARGE:          This evaluation is an incomplete preliminary report. The impression will be finalized when signed by the Attending Neurophysiologist.

## 2018-08-11 NOTE — Nurse Assessment (Signed)
ASSESSMENT NOTE    Note Started: 08/11/2018, 09:41     Initial assessment completed and recorded in EMR.  Report received from night shift nurse and orders reviewed. Plan of Care reviewed and appropriate, discussed with patient's.    Elisha Headland, RN RN

## 2018-08-11 NOTE — Nurse Discharge Note (Signed)
Discharge instructions given and reviewed with pt with teach back method.  Pt verbalized/ demostrated understanding all discharge instructions.  Pt has all belongings with pt and aware to take them with pt, pt signed for release of belonging form. Pt discharged with private ride.    O.Zonya Gudger, RN

## 2018-08-11 NOTE — Clinical Case Management (Signed)
Clinical Case Management Assessments    Name: Lisa Hinton  MRN: [id number]   Date of Birth: Apr 12, 1981 (19yr) Gender: female    Note Date: 08/11/2018 Note Time: 12:26       INITIAL ASSESSMENT NOTE    Patient able to participate in plan?: Yes        Developmental Level Appropriate (Pediatrics): N/A   Living arrangements: Spouse/Significant other, Children   Type of Residence: private residence  Support system: Spouse  Primary support person: Czaplicki,[phone]       CCS (Pediatrics): N/A   Pre-Hospital Services: None   Type of home health care services in place: None   DME in place: None       Does the patient have ongoing DC Planning needs?: No     Permanent Address: [address]  [place] [address]  Discharge Address:  same      Pre-Hospitalization self-care deficits: None   Pre-Hospitalization mobility: Independent   Bladder function: Continent   Bowel function: Continent     Patient can follow-up with:   PCP: Racheal Patches, MD  / Phone Number: [phone]  Preferred [facility]acy: [facility] [address] - [place], [place] - [address], [phone] [facility] [phone] Kallie Edward [phone] Dareen Piano, [place] - [address] [place], [phone] [facility] [phone] FX    Funding/Billing: Payor: [employer] / Plan: [employer]     Comments: Home with husband and 4 children per chart review with no anticipated d/c needs.     Date/Time: 08/11/2018 12:27  Electronically Signed by:   Lockie Mola, Case Manager  Pager: [phone]
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
## 2018-08-12 ENCOUNTER — Telehealth: Payer: Self-pay | Admitting: CLN NEUROPHYSIOLOGY

## 2018-08-12 NOTE — Telephone Encounter (Signed)
FYI: No further action needed with regard to this call.    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      Received an alert or voicemail message from support at [facility] that the patient has questions related to: Equipment and Supplies      Patient Identifiers  Patient Identifiers (3 required): Birth Date;Name;Address    Discharge Questions  Does patient need interpreter?: No  Did patient have questions about discharge instructions?: No  Is the patient experiencing any symptoms?: No  Was the patient able to pick up all of the medications prescribed at discharge?: No medications prescribed  Did the patient receive all supplies and equipment?: Not applicable  Does the patient have follow up appts scheduled in the recommended time frame?: No appt needed  Does patient have barriers to post-discharge care?: No  Any other concerns or questions?: No    Actions Taken  Actions Taken?: Resolved on call

## 2019-12-19 ENCOUNTER — Ambulatory Visit: Admit: 2019-12-19 | Discharge: 2019-12-19 | Payer: MEDICAID | Attending: Neurology

## 2019-12-19 DIAGNOSIS — G40119 Localization-related (focal) (partial) symptomatic epilepsy and epileptic syndromes with simple partial seizures, intractable, without status epilepticus: Secondary | ICD-10-CM

## 2019-12-19 DIAGNOSIS — F445 Conversion disorder with seizures or convulsions: Secondary | ICD-10-CM

## 2019-12-19 DIAGNOSIS — R569 Unspecified convulsions: Secondary | ICD-10-CM

## 2019-12-19 MED ORDER — RIZATRIPTAN 5 MG TABLET
5 | ORAL | Status: DC
Start: 2019-12-19 — End: 2020-03-27

## 2019-12-19 MED ORDER — CHOLECALCIFEROL (VITAMIN D3) 25 MCG (1,000 UNIT) CAPSULE
1000 | ORAL | Status: AC
Start: 2019-12-19 — End: ?

## 2019-12-19 MED ORDER — CLONIDINE HCL 0.1 MG TABLET
0.1 | ORAL | Status: DC | PRN
Start: 2019-12-19 — End: 2020-01-25

## 2019-12-19 MED ORDER — LEVETIRACETAM 500 MG TABLET
500 | ORAL | Status: DC
Start: 2019-12-19 — End: 2020-01-25

## 2019-12-19 MED ORDER — CYCLOBENZAPRINE 10 MG TABLET
10 | ORAL | Status: DC | PRN
Start: 2019-12-19 — End: 2020-03-27

## 2019-12-19 NOTE — Patient Instructions (Addendum)
My name is Dr. Veneda Melter.  You can reach me by phone at [phone], or through [facility].    Instructions for using MyChart are at the end of this document.  -------------  We will obtain a new MRI and video EEG to determine if you have epilepsy.    More information and support for people with epilepsy can be found at these websites:  [email or web address]  [email or web address].epilepsy.[email or web address]    Please consider creating an epilepsy diary at: [email or web address]  --------------  Here is some information about epilepsy and seizure safety:  The staff at [facility] cares about your safety. We would like to share tips to keep you safe and prevent injury when you or someone you know is having a seizure.   If you have any questions about the tips please ask your doctor or nurse or call the [facility] at [phone].   DO   Stay calm and speak calmly   Offer assistance   Note the time when the seizure starts and ends   Direct the person away from hazards and remove objects that may present a danger   Protect the head   If the person is having a convulsion, turn him or her onto their side and place something   flat and soft under their head to help prevent blocking the airway.   Loosen any tight clothing, especially around neck   Remove glasses   Stay with person until the seizure ends and he or she is fully alert and oriented   Look for identification or medical bracelet   Keep onlookers away   Explain to others what is going on   Be sensitive and supportive   When seizure is over offer further help if needed  DONT   Do not try to hold the person down or restrict their movements in any way unless he or she is in danger   Do not try to force anything into their mouth.   Do not try to move their tongue.   Do not try to give any medications or liquids by mouth   Do not place the person face down  When to call 911   If the seizure lasts longer than 5 minutes or if one seizure is immediately  followed by another   If you know this is the persons first seizure   If the person does not begin to breathe normally after the seizure stops   If the person does not wake up gradually by 5 minutes after a seizure   If there is no medical ID and no known history of seizures   If the seizure occurs in water   If there is an obvious injury   If the person who had the seizure, or family or caregivers, request an ambulance   If the person is pregnant   If the person has diabetes or some other medical condition  Precautions that may help prevent seizures:   Take your seizure medication as prescribed   Do not stop taking your seizure medication abruptly   Make sure you always get a good nights sleep   Exercise and remain active   Avoid alcohol   Eat a healthy balanced diet   Make sure to stay hydrated   Avoid stress   Before starting new medications (of any kind), talk with your doctor to make sure it is safe with medication you already take.   Illnesses may cause an increase in  seizures. Take precautions to avoid illness by washing your hands and staying away from people who are sick.  Carry Medical Identification   Be sure to carry medical identification to let others know you have seizures and give instructions on what to do if you do have one. Tell your family, friends, employers, your school and your community about seizures and what to do when someone has a seizure.   Safety Precautions   At Home   Take showers instead of baths   Shower when someone else is at home   Use shower chairs   Do not use scalding hot water   Make sure drains are working   Use an Neurosurgeon to avoid cuts   Electrical equipment such as razors and hair dryers should be used away from any water sources   If possible, have bathroom doors that open outward   Use appliances that turn off automatically   Diaper changing stations should be on the floor   Wall-to-wall carpeting reduces injuries for individuals who fall during seizures    Cushion corners of furniture that might cause injury   Cover exposed heating units like radiators   Do not use fireplace if home alone   Adjustments in cooking and food preparation   Use Microwave instead of stovetop when possible   Use pre-cut foods or food processer instead of knives for chopping  Swimming   Always swim with a friend   Psychologist, educational that you have epilepsy   Wear life jacket when in an open body of water, a boat, or during water sports   Avoid diving into a river, lake or ocean  Smoking   Do not smoke, as it increases risk for burns, injury or fire  Driving   Unless approved by your doctor, do not drive if during your seizures you:   become unaware of your surroundings   are unable to respond to others   you do not remember what happened during the seizure (referred to as loss of consciousness)  In [place], doctors are required by law to report all patients who experience episodes that cause loss of consciousness   When applying or renewing your drivers license, you must report to Department of Motor Vehicles (DMV) if you have seizures that cause you to lose awareness or consciousness  Work   Try to work routine hours if possible   Do not work with heavy machinery   Do not work near open bodies of water   Do not work around open flames   Avoid heights   Reviewed by health care specialists at [facility].  Last updated March 2012.
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
## 2019-12-20 DIAGNOSIS — H905 Unspecified sensorineural hearing loss: Secondary | ICD-10-CM

## 2020-01-17 NOTE — Telephone Encounter (Signed)
Spoke with Ernesta Amble  to confirm and answer any questions pertaining to admission for Monday, April 12.     Ms. Mahreen received information packet and has had the chance to review the indications and expectations of the admission.      She is aware to bring medications and take all doses as usual leading up to admission, including the morning of.    She is also aware that she has an MRI prior to admission.    Ms. Marge is hard of hearing and is able to only read lips.      Her insurance will provide ride to and from the hospital. She will bring the phone number.    Deetra Booton is aware that if she develops the following symptoms to call us immediately so that we can reschedule:  New or worsening cough  Shortness of breath  Fever  Unexplained muscle aches  In contact with someone who has had a confirmed case of Covid 19  Diagnosed as Covid + in the past 14 days    She is aware that she will be tested for the Covid-19 upon admission and that she will be under droplet precautions until the results are confirmed.     All questions were answered to her  satisfaction.

## 2020-01-18 NOTE — Telephone Encounter (Signed)
Copied from CRM #[id number]. Topic: EPIL - Medication  >> Jan 18, 2020  3:34 PM Benjamine Sprague wrote:  MEDICATION TEMPLATE    PATIENT NAME: Robyn Henry  DATE OF BIRTH: October 10, 1981  MRN: [id number]    MOBILE PHONE NUMBER:    [phone]   ALTERNATE PHONE NUMBER:     none  LEAVING A MESSAGE OK?:    Yes    PHARMACY NAME:     [id number] [place], [place] - [address]  PHARMACY PHONE:     [phone]  PHARMACY FAX:    [phone]    MED                   STR              DIR          QTY         # OF REF          PILLS LEFT      LR   1. levETIRAcetam (KEPPRA) 500 mg tablet          INSURANCE INFORMATION:  PAYOR: [facility]   PL[facility]: [facility]    MESSAGE PROBLEM:  Pt called stating she has been ALL OUT of the above Rx for 4 days. She thought she was taking Keppra, but was taking a different medication that looks similar. She states she now has more headaches and has been having small seizures. She is requesting to know if Dr. Bryson Corona can fill this for her or what else she should do. Please call pt at the above number to assist.       MESSAGE GENERATED BY [facility]  CRM NUMBER [id number] CREATED BY:    Benjamine Sprague,     01/18/2020,      3:34 PM

## 2020-01-19 NOTE — Telephone Encounter (Signed)
Following up from previous CRM message:     "MESSAGE / PROBLEM:  Anuja, Manka called and she would like to confirm should if she should continue to take the [facility] since she will be coming on Monday to hospital?what does Dr.recommend before testing ?"    Called & spoke with pt. Advised pt per Dr. Bryson Corona to continue taking all meds as prescribed until arrival at hospital. Pt verbalizes understanding & agrees. Note forwarded to Dr. Bryson Corona.

## 2020-01-19 NOTE — Telephone Encounter (Signed)
Note forwarded to Dr. Bryson Corona.     Copied from CRM [id number]. Topic: EPIL - Provider Only  >> Jan 19, 2020 11:30 AM Jacklynn Barnacle wrote:  GENERAL TEMPLATE    PATIENT NAME: Robyn Henry  DATE OF BIRTH: May 06, 1981  MRN: [id number]    HOME PHONE NUMBER: none  ALTERNATE PHONE NUMBER:   [phone]  LEAVING A MESSAGE OK?:    Yes    INSURANCE INFORMATION:  PA[facility]OR: [facility]   PLAN: [facility]    MESSAGE / PROBLEM:  Nimra, Puccinelli called and she would like to confirm should if she should continue to take the [facility] since she will be coming on Monday to hospital?what does Dr.recommend before testing ?Please assist.    MESSAGE GENERATED BY [facility]BULATORY SERVICES CALL CENTER  CRM NUMBER [phone] CREATED BY:    Jacklynn Barnacle,     01/19/2020,      11:30 AM

## 2020-01-23 ENCOUNTER — Inpatient Hospital Stay
Admit: 2020-01-23 | Discharge: 2020-01-26 | Disposition: A | Discharge status: Home / Self Care | Payer: MEDICAID | Source: Ambulatory Visit | Attending: Neurology | Admitting: Neurology

## 2020-01-23 ENCOUNTER — Ambulatory Visit: Admit: 2020-01-23 | Discharge: 2020-01-23 | Payer: MEDICAID

## 2020-01-23 DIAGNOSIS — H905 Unspecified sensorineural hearing loss: Secondary | ICD-10-CM

## 2020-01-23 DIAGNOSIS — F70 Mild intellectual disabilities: Secondary | ICD-10-CM

## 2020-01-23 DIAGNOSIS — Z8619 Personal history of other infectious and parasitic diseases: Secondary | ICD-10-CM

## 2020-01-23 DIAGNOSIS — Z20822 Contact with and (suspected) exposure to covid-19: Secondary | ICD-10-CM

## 2020-01-23 DIAGNOSIS — G40119 Localization-related (focal) (partial) symptomatic epilepsy and epileptic syndromes with simple partial seizures, intractable, without status epilepticus: Secondary | ICD-10-CM

## 2020-01-23 DIAGNOSIS — Q02 Microcephaly: Secondary | ICD-10-CM

## 2020-01-23 DIAGNOSIS — R569 Unspecified convulsions: Secondary | ICD-10-CM

## 2020-01-23 DIAGNOSIS — F4310 Post-traumatic stress disorder, unspecified: Secondary | ICD-10-CM

## 2020-01-23 MED ORDER — ONDANSETRON HCL 4 MG TABLET
4 | Freq: Four times a day (QID) | ORAL | Status: DC | PRN
Start: 2020-01-23 — End: 2020-01-26

## 2020-01-23 MED ORDER — NONFORMULARY REQUEST: Status: DC | PRN

## 2020-01-23 MED ORDER — IBUPROFEN 600 MG TABLET
600 | Freq: Four times a day (QID) | ORAL | Status: DC | PRN
Start: 2020-01-23 — End: 2020-01-26
  Administered 2020-01-23 – 2020-01-26 (×5): 600 mg via ORAL

## 2020-01-23 MED ORDER — LORAZEPAM 2 MG/ML INJECTION SOLUTION
2 | INTRAMUSCULAR | Status: DC | PRN
Start: 2020-01-23 — End: 2020-01-26

## 2020-01-23 MED ORDER — ONDANSETRON HCL (PF) 4 MG/2 ML INJECTION SOLUTION
4 | Freq: Four times a day (QID) | INTRAMUSCULAR | Status: DC | PRN
Start: 2020-01-23 — End: 2020-01-26

## 2020-01-23 MED ORDER — LORAZEPAM 2 MG/ML INJECTION SOLUTION
2 | Freq: Once | INTRAMUSCULAR | Status: DC | PRN
Start: 2020-01-23 — End: 2020-01-26

## 2020-01-23 MED ORDER — CHOLECALCIFEROL (VITAMIN D3) 25 MCG (1,000 UNIT) TABLET
1000 UNITS | ORAL | Status: DC
  Administered 2020-01-24 – 2020-01-26 (×3): 1000 [IU] via ORAL

## 2020-01-23 MED ORDER — LORAZEPAM 2 MG/ML INJECTION SOLUTION
2 | INTRAMUSCULAR | Status: AC
Start: 2020-01-23 — End: 2020-01-24

## 2020-01-23 MED ORDER — RIZATRIPTAN 5 MG DISINTEGRATING TABLET: 5 mg | ORAL | Status: DC | PRN

## 2020-01-23 MED ORDER — CYCLOBENZAPRINE 10 MG TABLET
10 mg | ORAL | Status: DC | PRN
  Administered 2020-01-24 – 2020-01-25 (×2): 10 mg via ORAL

## 2020-01-23 MED ORDER — SODIUM CHLORIDE 0.9 % (FLUSH) INJECTION SYRINGE
0.9 | INTRAMUSCULAR | Status: DC | PRN
Start: 2020-01-23 — End: 2020-01-26
  Administered 2020-01-25 (×2): 3 mL via INTRAVENOUS

## 2020-01-23 MED ORDER — SODIUM CHLORIDE 0.9 % (FLUSH) INJECTION SYRINGE
0.9 | Freq: Three times a day (TID) | INTRAMUSCULAR | Status: DC
Start: 2020-01-23 — End: 2020-01-26
  Administered 2020-01-23 – 2020-01-25 (×5): 3 mL via INTRAVENOUS

## 2020-01-23 MED FILL — IBUPROFEN 600 MG TABLET: 600 mg | ORAL | Qty: 1

## 2020-01-23 MED FILL — LORAZEPAM 2 MG/ML INJECTION SOLUTION: 2 mg/mL | INTRAMUSCULAR | Qty: 1

## 2020-01-23 NOTE — Procedures (Addendum)
[facility]  [address] 8[address] Tel:  [phone]  [place], [place] [address] Fax: [phone]  ______________________________________________    VIDEO-EEG Daily Report  Patient Name: Robyn Henry  MRN: [id number]  DOB: 1981-04-12)  Room/Bed: 825/825-L1  Attending / Referring Physician: Asencion Partridge, MD    OBJECT OF RECORDING: 39 y.o. 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        STUDY START: 01/23/2020 at 1520  STUDY STOP: 01/23/2020 at 2359, recording continues    CONDITIONS OF RECORDING:  Recordings were obtained using a standard international 10-20 electrode placement in a 19-channel standard recording supplemented with FT9/10, TP9/10 of the 10-10 System for a total of 23 electrodes along with a single electrocardiogram chest electrode. The recordings were obtained using a reference electrode and reformatted into bipolar montages for review. Concurrent continuous video recording was utilized. Throughout the entire monitoring evaluation, a video sitter (EEG technologist) observed the patient in real-time. The Natus/Persyst spike and seizure detection computer program was used to screen the EEG. An epilepsy fellow and the attending neurologist reviewed the entire recording including detections. They also reviewed concurrent EEG and Video during episodes of interest.    FINDINGS - DAILY SUMMARIES AND DETAILED SEIZURE DATA:   DAY 1: 01/23/2020 from 1520 to 2359  Relevant medications: Home LEV 500mg  BID held   Background: The waking background at rest is continuous, composed of an admixture of largely alpha and beta frequencies. The expected anterior-posterior voltage and frequency organization was present, with intermixed faster frequencies anteriorly and a symmetric and well-formed posterior  dominant alpha rhythm of 9 Hz that was reactive to eye opening. There were no interhemispheric asymmetries or areas of focal slowing.   Photic stimulation was performed, with no evidence of photic driving. One event (as described in Events below) precipitated.   During sleep, there is the emergence of normal sleep architecture including vertex waves, symmetric and well-defined sleep spindles and K complexes.   Interictal: No interictal epileptiform abnormalities.   Ictal/push-button events: There are no electrographic or clinical seizures. During photic stimulation, there is a prolonged event of decreased responsiveness, with waxing and waning irregular facial twitching predominantly of the right side, but migrating to left occasionally. Right face intermittently held in a fixed half rictus. There is no associated ictal pattern on EEG, and there is a symmetric and well-regulated PDR of 9 Hz present throughout event.    IMPRESSION:   This continuous Video-EEG monitoring study was normal. There were no interhemispheric asymmetries, areas of focal slowing, epileptiform discharges or seizures. 1 event recorded of decreased responsiveness with right>left face twitching was non-epileptic in etiology. Further recording off antiseizure medications is necessary for diagnostic clarity.     Note: This signed report pertains to the time period between START and STOP above. The continuous-EEG recording itself is still ongoing.    Epilepsy Fellow(s):  MD  Epilepsy/EMU attending(s) and dates of service:  ATTESTATION  I have personally reviewed the EEG and discussed the interpretation with the fellow(s) and agree with the findings as documented above.    Zane Herald MD, 01/23/2020
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
## 2020-01-23 NOTE — Nursing Note (Signed)
Escorted pt to MRI for brain scan, at the end of the scan pt began to seize (B/L arm and head jerky movements with eye fluttering and unresponsive to commands).  Pulled pt out and paged EMU to notify of seizure.  Did not give Ativan because BS RN said to notify EMU before giving. Pt seized for about 12-15 minutes before she was able to communicate and movements ceased. BP 137/87 HR 102 O2 sat 100% on RA. Returned pt to 825L and reported off to [employer]  Paged EMU and let them know pt had returned to her room. EEG tech began to place leads upon arrival. VSS in room and pt able to communicate. C/o feeling "very tired"

## 2020-01-24 DIAGNOSIS — F445 Conversion disorder with seizures or convulsions: Secondary | ICD-10-CM

## 2020-01-24 LAB — COVID-19 RNA, RT-PCR/NUCLEIC A: COVID-19 RNA, RT-PCR/Nucleic A: NOT DETECTED

## 2020-01-24 MED FILL — CYCLOBENZAPRINE 10 MG TABLET: 10 mg | ORAL | Qty: 1

## 2020-01-24 MED FILL — IBUPROFEN 600 MG TABLET: 600 mg | ORAL | Qty: 1

## 2020-01-24 MED FILL — CHOLECALCIFEROL (VITAMIN D3) 25 MCG (1,000 UNIT) TABLET: 1000 UNITS | ORAL | Qty: 1
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
## 2020-01-24 NOTE — Procedures (Addendum)
[facility]  [address] Tel:  [phone]  [place] [address] Fax: [phone]  ______________________________________________    VIDEO-EEG Daily Report  Patient Name: Robyn Henry  MRN: [id number]  DOB: 1981-09-11)  Room/Bed: 825/825-L1  Attending / Referring Physician: Cristine Polio, MD    OBJECT OF RECORDING: 39 y.o. female undergoing video-EEG monitoring in order to evaluate her symptoms of episode(s) concerning for seizure.        STUDY START: 01/23/2020 at 1520  STUDY STOP: 01/24/2020 at 2359, recording continues    CONDITIONS OF RECORDING:  Recordings were obtained using a standard international 10-20 electrode placement in a 19-channel standard recording supplemented with FT9/10, TP9/10 of the 10-10 System for a total of 23 electrodes along with a single electrocardiogram chest electrode. The recordings were obtained using a reference electrode and reformatted into bipolar montages for review. Concurrent continuous video recording was utilized. Throughout the entire monitoring evaluation, a video sitter (EEG technologist) observed the patient in real-time. The Natus/Persyst spike and seizure detection computer program was used to screen the EEG. An epilepsy fellow and the attending neurologist reviewed the entire recording including detections. They also reviewed concurrent EEG and Video during episodes of interest.    FINDINGS - DAILY SUMMARIES AND DETAILED SEIZURE DATA:   DAY 1: 01/23/2020 from 1520 to 2359  Relevant medications: Home LEV 500mg  BID held   Background: The waking background at rest is continuous, composed of an admixture of largely alpha and beta frequencies. The expected anterior-posterior voltage and frequency organization was present, with intermixed faster frequencies anteriorly and a symmetric and well-formed posterior dominant alpha rhythm of 9 Hz that was reactive to eye opening. There were no interhemispheric asymmetries or areas of focal  slowing.   Photic stimulation was performed, with no evidence of photic driving. One event (as described in Events below) precipitated.   During sleep, there is the emergence of normal sleep architecture including vertex waves, symmetric and well-defined sleep spindles and K complexes.   Interictal: No interictal epileptiform abnormalities.   Ictal/push-button events: There are no electrographic or clinical seizures. During photic stimulation, there is a prolonged event of decreased responsiveness, with waxing and waning irregular facial twitching predominantly of the right side, but migrating to left occasionally. Right face intermittently held in a fixed half rictus. There is no associated ictal pattern on EEG, and there is a symmetric and well-regulated PDR of 9 Hz present throughout event.    DAY 2: 01/24/2020 from 0000 to 2359  Medication changes: None   Background: Unchanged   Interictal: Unchanged   Ictal/push-button events: 1 button press at 1016 for episode of eye fluttering and subjective "stiffness" in patient's neck. No ictal patterns on EEG, and PDR remains present and symmetric throughout, even during periods of demonstrated unresponsiveness. 1 button press at 1614 during photic stimulation for event similar in features to event on 4/12, but not frankly stereotyped; ie this episode includes rapid tongue protrusion. PDR remains present and symmetric throughout while patient is unresponsive, and no ictal patterns are seen on EEG.    IMPRESSION:   This continuous Video-EEG monitoring study was normal. There were no interhemispheric asymmetries, areas of focal slowing, epileptiform discharges or seizures. 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     Note: This signed report pertains to the time period between START and STOP above. The continuous-EEG recording itself  is  still ongoing.    Epilepsy Fellow(s):  Zane Herald MD  Epilepsy/EMU attending(s) and dates of service:    ATTESTATION  I have personally reviewed the EEG and discussed the interpretation with the fellow(s) and agree with the findings as documented above.    Margurite Auerbach MD, 4/12 - 01/24/2020

## 2020-01-24 NOTE — Progress Notes (Addendum)
EPILEPSY MONITORING UNIT PROGRESS NOTE    24 Hour Course: VEEG monitoring initiated yesterday. 1 event of shaking and reduced responsiveness during photic stimulation yesterday and 1 again today, without ictal correlate on EEG. 1 event of feeling "locked up" and "stiff" this morning, also without ictal correlate on EEG.    Subjective: Feeling quite a bit better this afternoon, post-event. Denies headache. Feels like the two types of events she has had so far are fairly representative of her events at home, but the second twitching episode had more confusion and difficulty answering questions than she usually has.     Current Medications:  Scheduled Meds:   0.9% sodium chloride flush  3 mL Intravenous Q8H SCH    cholecalciferol (vitamin D3)  1,000 Units Oral Daily SCH     Continuous Infusions:  PRN Meds:.0.9% sodium chloride flush, cyclobenzaprine, ibuprofen, LORazepam, LORazepam, ondansetron **OR** ondansetron, rizatriptan    Objective:  Temp:  [36.3 C (97.3 F)-36.7 C (98.1 F)] 36.6 C (97.9 F)  Heart Rate:  [63-83] 75  *Resp:  [16-18] 17  BP: (92-116)/(61-79) 116/79  General:   Skin breakdown at electrode site: No  Neurological:  Mental Status: Alert with fluent language.   Cranial Nerve: Extraocular movements were intact without nystagmus. Face was symmetric without dysarthria.  Motor: There was normal bulk. There was no pronator drift, and fast finger movements and foot taps were normal and symmetric. Distal strength was full in all 4 extremities.  Reflexes: Deferred  Sensation: Intact to light touch throughout  Coordination: Normal FNF  Gait: Deferred    Video/EEG findings from 01/23/2020 to today:  Normal.    ASSESSMENT:  39 y.o. 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 Exam non-focal. Prior workup with video EEG showing non-epileptic spells by  report. MRI obtained during this admission shows multifocal white matter T2 hyperintensities more abundant than expected for age and somewhat atypical distribution for vascular disease. Now with 2 spells of non-epileptic twitching provoked by photic stimulation, and 1 non-epileptic spell of stiffening of neck and body. Will continue video EEG to assess for any evidence of comorbid epilepsy off antiseizure medications, and tentatively plan for discharge Thursday.     In my opinion, continued video EEG telemetry is medically necessary for definitive diagnosis and treatment of her refractory seizures in the setting of provocative measures.    PLAN:   Continue VEEG   Seizure/spell provoking measures:   Changes to home dose anti-convulsants on admission:  1. LEV 500mg  BID held on admission   Photic stimulation: Completed     Continue other home dose medication or equivalent medications for other chronic medical conditions   PRN IV Ativan for prolonged convulsive activity lasting >5 min or cluster of 2 to 3 seizures within 30 minutes and/or without regaining of consciousness   Seizure precautions   Consultation with Dr. of psychiatry given prior dx of PNES and ongoing psychosocial stressors, follow up recommendations   DVT ppx: SCDs   GI ppx: Not indicated   Code status: Full   Dispo: Pending timing of seizure precipitation, diagnostic outcomes, and safety    I discussed seizure safety/fall precautions: Yes  Patient to be out of bed, in chair as tolerated: Yes  Patient to be out of bed with assistance and must inform the nurse: Yes     Attending Attestation    My date  of service is 01/24/2020. I was present for and performed key portions of an examination of the patient. I am personally involved in the management of the patient.      Events  Typical events yesterday and today    Subjective  Tolerating hospitalization  In good spirits  Feels "refreshed" after sleeping/resting yesterday    Recent  vital signs:  BP 116/79 (BP Location: Right upper arm, Patient Position: Lying)   Pulse 75   Temp 36.6 C (97.9 F) (Oral)   Resp 17   Wt 62.1 kg (137 lb)   LMP 01/05/2020 (Approximate)   SpO2 97%   Breastfeeding No   BMI 25.06 kg/m     I have personally reviewed and interpreted the following studies   MRI Brain showing multiple white matter hyperintensities in bilateral hemisphere, most consistent with prior injury/infection and consistent with history of congenital CMV infection with neurological sequelae.    EEG thus far normal  Events recorded thusfar most consistent with psychogenic nonepileptic seizures    Assessment and Plan    I agree with the findings and care plan as documented.  Additional findings: psychiatry consultation today to better understand patient's condition and determine treatment strategy  Continue monitoring off antiseizure medications as patient at risk for comorbid epilepsy    Cristine Polio, MD

## 2020-01-25 MED ORDER — ESCITALOPRAM 5 MG TABLET
5 mg | ORAL | Status: DC
  Administered 2020-01-26 (×2): 2.5 mg via ORAL

## 2020-01-25 MED ORDER — ESCITALOPRAM 5 MG TABLET: 5 mg | ORAL | 3 refills | Status: DC

## 2020-01-25 MED FILL — IBUPROFEN 600 MG TABLET: 600 mg | ORAL | Qty: 1

## 2020-01-25 MED FILL — ONDANSETRON HCL 4 MG TABLET: 4 mg | ORAL | Qty: 1

## 2020-01-25 MED FILL — CYCLOBENZAPRINE 10 MG TABLET: 10 mg | ORAL | Qty: 1

## 2020-01-25 MED FILL — CHOLECALCIFEROL (VITAMIN D3) 25 MCG (1,000 UNIT) TABLET: 1000 UNITS | ORAL | Qty: 1

## 2020-01-25 NOTE — Progress Notes (Addendum)
EPILEPSY MONITORING UNIT PROGRESS NOTE    24 Hour Course: 1 event of twitching during photic stimulation yesterday afternoon. Patient had 1 event of shaking, decreased responsiveness and mouth foaming this morning.    Subjective: Feeling tired after the event this morning.    Current Medications:  Scheduled Meds:   0.9% sodium chloride flush  3 mL Intravenous Q8H [place]    cholecalciferol (vitamin D3)  1,000 Units Oral Daily [place]     Continuous Infusions:  PRN Meds:.0.9% sodium chloride flush, cyclobenzaprine, ibuprofen, LORazepam, LORazepam, ondansetron **OR** ondansetron, rizatriptan    Objective:  Temp:  [36.5 C (97.7 F)-36.8 C (98.2 F)] 36.8 C (98.2 F)  Heart Rate:  [66-81] 81  *Resp:  [16-18] 18  BP: (95-116)/(56-79) 98/66  General:   Skin breakdown at electrode site: No  Neurological:  Mental Status: Alert with fluent language.   Cranial Nerve: Extraocular movements were intact without nystagmus. Face was symmetric without dysarthria.  Motor: There was normal bulk. Fast hand movements bilaterally  Reflexes: Deferred  Sensation: Intact to light touch throughout  Coordination: Deferred  Gait: Deferred    Video/EEG findings from 01/23/2020 to today:  Normal.    ASSESSMENT:  39 y.o. 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 Exam non-focal. Prior workup with video EEG showing non-epileptic spells by report. MRI obtained during this admission shows multifocal white matter T2 hyperintensities more abundant than expected for age and somewhat atypical distribution for vascular disease. Now with 2 spells of non-epileptic twitching provoked by photic stimulation, 1 non-epileptic spell of stiffening of neck and body, and 1 larger event of shaking with foaming at mouth, also non-epileptic. No evidence of comorbid epilepsy on EEG to date; Patient now believes we  have characterized the entire spectrum of her paroxysmal "seizure activity". She agrees with discharge to home tomorrow off antiseizure medications.     PLAN:   Continue VEEG   Seizure/spell provoking measures:   Changes to home dose anti-convulsants on admission:   LEV 500mg  BID held on admission; we will not restart after discussion with patient   Photic stimulation: Completed   Continue other home dose medication or equivalent medications for other chronic medical conditions   PRN IV Ativan for prolonged convulsive activity lasting >5 min or cluster of 2 to 3 seizures within 30 minutes and/or without regaining of consciousness   Seizure precautions   Consultation with Dr. Lalla Brothers of psychiatry given prior dx of PNES and ongoing psychosocial stressors, will discuss whether she would like to begin escitalopram per his recommendation   DVT ppx: SCDs   GI ppx: Not indicated   Code status: Full   Dispo: Planned for tomorrow; SW has helped set up medical transport back to home for noon    I discussed seizure safety/fall precautions: Yes  Patient to be out of bed, in chair as tolerated: Yes  Patient to be out of bed with assistance and must inform the nurse: Yes     Attending Attestation    My date of service is 38/14/2021. I was present for and performed key portions of an examination of the patient. I am personally involved in the management of the patient.      Events  Patient had a typical seizure with twitching, shaking, foaming at the mouth. She now feels that we have characterized all of her seizure subtypes. \  Her uncle  from [place] came to visit and was at her bedside    Subjective  Tolerating hospitalization    Recent vital signs:  BP 110/77 (BP Location: Left upper arm, Patient Position: Lying)   Pulse 86   Temp 36.5 C (97.7 F) (Oral)   Resp 18   Wt 62.1 kg (137 lb)   LMP 01/05/2020 (Approximate)   SpO2 96%   Breastfeeding No   BMI 25.06 kg/m     I have personally reviewed and  interpreted the following studies   Telemetry showing normal awake and asleep EEG.         I spoke with patient, her uncle and her husband (by phone) at length about findings, diagnosis and treatment plan.     Assessment and Plan    I agree with the findings and care plan as documented.  Additional comments: psychogenic nonepileptic spells without evidence for comorbid epilepsy in woman with congenital CMV and mild intellectual disability and other sequelae (hearing impairment, microcephaly)    We discussed treatment plan in detail  1. Discontinue antiseizure medications  2. Start escitalopram 2.5 mg daily  3. Patient to pursue physical therapy to help with chronic neck and back pain  4. Hopefully we can eventually identify appropriate supportive therapy. Ideally, she would benefit from extensive DBT or psychodynamic therapy to help with emotional regulation and interpersonal relationship management  5. Follow up with me for longitudinal care    Asencion Partridge, MD
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
## 2020-01-25 NOTE — Procedures (Addendum)
[facility]  [address] 8th [address] Tel:  [phone]  [place] [address] Fax: [phone]  ______________________________________________    VIDEO-EEG Daily Report  Patient Name: Robyn Henry  MRN: [id number]  DOB: July 15, 1981)  Room/Bed: 825/825-L1  Attending / Referring Physician: Cristine Polio, MD    OBJECT OF RECORDING: 39 y.o. female undergoing video-EEG monitoring in order to evaluate her symptoms of episode(s) concerning for seizure.        STUDY START: 01/23/2020 at 1520  STUDY STOP: 01/25/2020 at 2359, recording continues    CONDITIONS OF RECORDING:  Recordings were obtained using a standard international 10-20 electrode placement in a 19-channel standard recording supplemented with FT9/10, TP9/10 of the 10-10 System for a total of 23 electrodes along with a single electrocardiogram chest electrode. The recordings were obtained using a reference electrode and reformatted into bipolar montages for review. Concurrent continuous video recording was utilized. Throughout the entire monitoring evaluation, a video sitter (EEG technologist) observed the patient in real-time. The Natus/Persyst spike and seizure detection computer program was used to screen the EEG. An epilepsy fellow and the attending neurologist reviewed the entire recording including detections. They also reviewed concurrent EEG and Video during episodes of interest.    FINDINGS - DAILY SUMMARIES AND DETAILED SEIZURE DATA:   DAY 1: 01/23/2020 from 1520 to 2359  Relevant medications: Home LEV 500mg  BID held   Background: The waking background at rest is continuous, composed of an admixture of largely alpha and beta frequencies. The expected anterior-posterior voltage and frequency organization was present, with intermixed faster frequencies anteriorly and a symmetric and well-formed posterior dominant alpha rhythm of 9 Hz that was reactive to eye opening. There were no interhemispheric asymmetries or areas of focal  slowing.   Photic stimulation was performed, with no evidence of photic driving. One event (as described in Events below) precipitated.   During sleep, there is the emergence of normal sleep architecture including vertex waves, symmetric and well-defined sleep spindles and K complexes.   Interictal: No interictal epileptiform abnormalities.   Ictal/push-button events: There are no electrographic or clinical seizures. During photic stimulation, there is a prolonged event of decreased responsiveness, with waxing and waning irregular facial twitching predominantly of the right side, but migrating to left occasionally. Right face intermittently held in a fixed half rictus. There is no associated ictal pattern on EEG, and there is a symmetric and well-regulated PDR of 9 Hz present throughout event.    DAY 2: 01/24/2020 from 0000 to 2359  Medication changes: None   Background: Unchanged   Interictal: Unchanged   Ictal/push-button events: 1 button press at 1016 for episode of eye fluttering and subjective "stiffness" in patient's neck. No ictal patterns on EEG, and PDR remains present and symmetric throughout. 1 button press at 1614 during photic stimulation for event similar in features to event on 4/12, but not frankly stereotyped; ie this episode includes rapid tongue protrusion. PDR remains present and symmetric throughout, and no ictal patterns are seen on EEG.    DAY 3: 01/25/2020 from 0000 to 2359  Medication changes: None   Background: Unchanged   Interictal: Unchanged   Ictal/push-button events: 1 button press at 1122 of prolonged event of eye fluttering, foaming at the mouth, and waxing and waning facial and arm twitching, with unresponsiveness. EEG shows rhythmic movement and muscle artifact, but no cerebral ictal patterns. PDR is intermittently present and symmetric throughout and after event, reactive to eye closure.  IMPRESSION:   This continuous Video-EEG monitoring study was normal. There were  no interhemispheric asymmetries, areas of focal slowing, epileptiform discharges or seizures. 3 events recorded of decreased responsiveness with right>left face twitching (one with foaming at the mouth), and 1 event of neck and body "stiffness" were non-epileptic in etiology.    Note: This signed report pertains to the time period between START and STOP above. The continuous-EEG recording itself is still ongoing.    Epilepsy Fellow(s):  Zane Herald MD  Epilepsy/EMU attending(s) and dates of service:  ATTESTATION  I have personally reviewed the EEG and discussed the interpretation with the fellow(s) and agree with the findings as documented above.    Margurite Auerbach MD, 4/12 - 01/25/2020

## 2020-01-25 NOTE — Interdisciplinary (Signed)
SW Note: Provided serv ices on behalf of pt.    Per RN Consult pt ready for d/c but requires transport from an  Agency that requires 24 hour notice. SW met at bedside with pt who was AA&Ox4. SW obtained all necessary information to set up ride. SW secured transport for Thursday 4/15 at 12 pm through [employer]. Pt will need to be downstairs by 11:50a. The phone number for any changes is: [phone].       SW updated pt at bedside.      Princella Pellegrini, MSW  Clinical Social Worker  [facility])

## 2020-01-26 MED FILL — IBUPROFEN 600 MG TABLET: 600 mg | ORAL | Qty: 1

## 2020-01-26 MED FILL — CHOLECALCIFEROL (VITAMIN D3) 25 MCG (1,000 UNIT) TABLET: 1000 UNITS | ORAL | Qty: 1

## 2020-01-26 MED FILL — ESCITALOPRAM 5 MG TABLET: 5 mg | ORAL | Qty: 1

## 2020-01-26 NOTE — Procedures (Addendum)
[facility]  [address], 8[address] Tel:  [phone]  [place], [place] [address] Fax: [phone]  ______________________________________________    VIDEO-EEG Daily Report and Summary of Multiday Recording  Patient Name: Robyn Henry  MRN: [id number]  DOB: 10/16/1980)  Room/Bed: 825/825-L1  Attending / Referring Physician: Asencion Partridge, MD    OBJECT OF RECORDING: 39 y.o. female undergoing video-EEG monitoring in order to evaluate her symptoms of episode(s) concerning for seizure.        STUDY START: 01/23/2020 at 1520  STUDY STOP: 01/26/2020 at 1124    CONDITIONS OF RECORDING:  Recordings were obtained using a standard international 10-20 electrode placement in a 19-channel standard recording supplemented with FT9/10, TP9/10 of the 10-10 System for a total of 23 electrodes along with a single electrocardiogram chest electrode. The recordings were obtained using a reference electrode and reformatted into bipolar montages for review. Concurrent continuous video recording was utilized. Throughout the entire monitoring evaluation, a video sitter (EEG technologist) observed the patient in real-time. The Natus/Persyst spike and seizure detection computer program was used to screen the EEG. An epilepsy fellow and the attending neurologist reviewed the entire recording including detections. They also reviewed concurrent EEG and Video during episodes of interest.    FINDINGS - DAILY SUMMARIES AND DETAILED SEIZURE DATA:   DAY 1: 01/23/2020 from 1520 to 2359  Relevant medications: Home LEV 500mg  BID held   Background: The waking background at rest is continuous, composed of an admixture of largely alpha and beta frequencies. The expected anterior-posterior voltage and frequency organization was present, with intermixed faster frequencies anteriorly and a symmetric and well-formed posterior dominant alpha rhythm of 9 Hz that was reactive to eye opening. There were no interhemispheric asymmetries or areas  of focal slowing.   Photic stimulation was performed, with no evidence of photic driving. One event (as described in Events below) precipitated.   During sleep, there is the emergence of normal sleep architecture including vertex waves, symmetric and well-defined sleep spindles and K complexes.   Interictal: No interictal epileptiform abnormalities.   Ictal/push-button events: There are no electrographic or clinical seizures. During photic stimulation, there is a prolonged event of decreased responsiveness, with waxing and waning irregular facial twitching predominantly of the right side, but migrating to left occasionally. Right face intermittently held in a fixed half rictus. There is no associated ictal pattern on EEG, and there is a symmetric and well-regulated PDR of 9 Hz present throughout event.    DAY 2: 01/24/2020 from 0000 to 2359  Medication changes: None   Background: Unchanged   Interictal: Unchanged   Ictal/push-button events: 1 button press at 1016 for episode of eye fluttering and subjective "stiffness" in patient's neck. No ictal patterns on EEG, and PDR remains present and symmetric throughout. 1 button press at 1614 during photic stimulation for event similar in features to event on 4/12, but not frankly stereotyped; ie this episode includes rapid tongue protrusion. PDR remains present and symmetric throughout, and no ictal patterns are seen on EEG.    DAY 3: 01/25/2020 from 0000 to 2359  Medication changes: None   Background: Unchanged   Interictal: Unchanged   Ictal/push-button events: 1 button press at 1122 of prolonged event of eye fluttering, foaming at the mouth, and waxing and waning facial and arm twitching, with unresponsiveness. EEG shows rhythmic movement and muscle artifact, but no cerebral ictal patterns. PDR is intermittently present and symmetric throughout and after event, reactive to eye closure.  DAY 4: 01/26/2020 from 0000 to 1124  Medication changes: None    Background: Unchanged   Interictal: Unchanged   Ictal/push-button events: Button push at 1111 for event similar to one on day 3. EEG shows no ictal correlate and PDR remains intact and symmetric throughout.      IMPRESSION:   This continuous Video-EEG monitoring study was normal. There were no interhemispheric asymmetries, areas of focal slowing, epileptiform discharges or seizures. Three events recorded of decreased responsiveness with right>left face twitching (two with foaming at the mouth), and 1 event of neck and body "stiffness." with persistent PDR during unresponsiveness.  Patient confirmed that all of her typical events were characterized.     In summary, findings consistent with psychogenic nonepileptic spells and no evidence for comorbid epilepsy.     Epilepsy Fellow(s):  Alwyn Pea MD  Epilepsy/EMU attending(s) and dates of service:  ATTESTATION  I have personally reviewed the EEG and discussed the interpretation with the fellow(s) and agree with the findings as documented above.  [name] MD, 4/12 - 01/26/2020

## 2020-01-26 NOTE — Discharge Summary (Addendum)
Epilepsy Center  [facility]    [facility]  [address] 8th [address] Tel:  [phone]  [place] [address] Fax: [phone]        Discharge Summary for Epilepsy Monitoring Unit Admission  Date of admission: 01/23/2020  Date of discharge: 01/26/2020    Patient Name: Robyn Henry  MRN#:  [id number]  Date of Birth:  [birth year]  Referring Physician(s): Veneda Melter, MD  Supervising Attending: [name] MD  Dictating physician: Alwyn Pea MD  History of present illness:   Robyn Henry is a 39 y.o. 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    Please refer to the admission note in the EHR dated 01/23/2020 for full details of the patient's HPI and epilepsy/spell history.    Hospital Course:   She was admitted to the Epilepsy Monitoring Unit (EMU) here at the [facility], hooked up to scalp EEG, and continuous Video-EEG monitoring was initiated. To help precipitate seizures, her home anti-seizure medicine regimen (LEV 523m BID) was heldShe begin having spells immediately on arrival and in the MRI scanner, both prior to Video/EEG monitoring. She additionally had 2 prolonged episodes of waxing and waning facial twitching with decreased responsiveness triggered by photic stimulation, 1 episode of subjective stiffness primarily of her neck, with eye fluttering, and 2 episodes of facial and arm twitching with foaming at the mouth, none of which had ictal correlate on EEG, and all of which had persistence of PDR throughout (even during periods of unresponsiveness), consistent with psychogenic non-epileptic spells. EEG was normal throughout the hospitalization. Dr. VLalla Brothersfrom psychiatry saw her for additional psychiatric evaluation and counseling and recommended initiation of escitalopram with uptitration to 118m daily and that patient would benefit from psychodynamic counseling or DBT.Patient was in agreement with stopping levetiracetam as no evidence for comorbid epilepsy.   For further details of the Video-EEG findings, please refer to the separate summary report.    MRI obtained during admission showed multifocal white matter T2 hyperintensities, greater than expected for age, possible sequelae of prior injury (eg. peri-natal CMV).    We devoted extensive discussions with the patient on several occasions and with her uncle and husband at her request. We carefully reviewed the diagnosis, etiology, treatment and prognosis of psychogenic, non-epileptic spells (NES).     Assessment:   #Non-epileptic spells - Multiple spell types (as described above), all non-epileptic. Recommended outpatient follow up with psychotherapy and neurology to optimize risk factors.     Plan:   We had an extensive discussion concerning the differential diagnosis and the findings during the hospitalization. All of her questions were answered to her satisfaction. We reviewed the pros/cons of treatment and management options. We provided the patient with additional information regarding NES, and offered assistance to help identify (or coordinate with) a mental health provider closer to the patient's home. We also recommended physical therapy to assist with her ongoing neck pain and back pain. We reviewed sleep hygiene and chronic sleep disturbances may be playing a role in patient's frequent headaches. Lastly, we introduced the possibility of pursuing an [facility] with [facility] her developmental history and continued adaptive functioning difficulties.     Medications:  At discharge, she was started on escitalopram, with planned uptitration to 1059maily    We reviewed the pros/cons/dose-related and idiosyncratic side  effects of these medications with the patient, and asked her to call us with any questions, or if she  experiences any side-effects.    Further Studies / Investigations:  The patient does not need further investigations at this time.     Driving:  We counseled the patient that she cannot drive.   We did not submit a confidential morbidity report on this patient to the department of health, because this has already been submitted and the patient does not have a valid driver's license.  Additional counseling concerning seizure safety was discussed with the patient and family and documented in the after-visit summary.    Physical Exam upon discharge:       Vitals:    01/25/20 0819 01/25/20 1524 01/25/20 2100 01/26/20 0754   BP: 98/66 110/77 110/72 110/65   BP Location: Left upper arm Left upper arm Left upper arm Left upper arm   Patient Position: Lying Lying Lying Lying   Pulse: 81 86 83 78   Resp: _0 Temp: 36.8 C (98.2 F) 36.5 C (97.7 F) 36.6 C (97.9 F) 36.3 C (97.3 F)   TempSrc: Oral Oral Oral Oral   SpO2: 98% 96% 97% 99%   Weight:         Gen: Pleasant, NAD  HEENT: MMM  MS: Alert and oriented to conversation  CN: EOMI, facial movements symmetric  MTR: Normal bulk, moves extremities antigravity  GAIT: deferred       Disposition:   Home in stable condition.    Discharge outpatient clinic: Patient will follow up with Dr. Bobbye Charleston in clinic. Feb 27, 2020    Discharge Instructions provided to the patient (if any):  Other Instructions       [address] 8th [address]  Tel:  [phone]  [place] [address]  Fax: [phone]      Dear Robyn Henry,     This paperwork serves as your summary of your visit to [facility].     Brief Summary your hospital admission:  You were admitted to the Epilepsy Monitoring Unit (EMU) at the [facility] in order to further characterize your spells. While you were here, you had multiple spells that you told us were similar to those that you have at home, including face twitching, foaming at the mouth, and stiffening up. During all your  typical spells there was no "epileptic" seizure activity on the brainwave test (EEG). 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    NES are very common: About one quarter of the patients coming through the epilepsy monitoring unit are diagnosed with NES. Being diagnosed with NES doesn't mean that these events are not real - in fact, these spells happen subconsciously. For many people, it is their body's way of telling them that there are unresolved emotional stressors or conflicts that have been piling up, usually over years or longer. This can be made worse for many people by adding new, other recent stressors.     How can I get treatment for these spells?  These spells are not dangerous for your brain, but they are stressful in-and-of themselves, and are important to have treated. Since NES are not epileptic seizures (meaning that they are not driven by abnormal electrical seizure activity in the brain), the treatment for your spells is not an anti-seizure drug, since they are unlikely to help these spells and could cause side  effects. Instead, the treatment is dedicated therapy sessions, targeting your underlying emotional stressors in life and providing you strategies to address and take care of them in a healthy way. Many patients find that counseling in general helps, and meeting with your psychiatrist to discuss this would be a great way to start targeting these spells to improve them and hopefully make them go away in the future. It would be helpful for your current therapist to discuss your care with Dr. Lalla Brothers (psychiatrist who met with you during the hospitalization). Please give Dr. Clabe Seal number [phone] (website: [email or web address]) to your therapist.     Medication instructions after discharge:  -Stop Levetiracetam. As this is a medicine that only treats epileptic seizures, not non-epileptic ones, we believe it is  unlikely to be helpful and may be causing side effects  -Start escitalopram 5 mg daily for 3 days, then increase to 7.38m daily for 3 days, then increase to 135mdaily    We are also encouraging you to restart physical therapy to help you with your back and neck pain and reducing neck/back pain may help you with headaches.    Lastly, we talked about the possibility of you applying to be a client of the FaExperimentwebsite: htCartCleaning.huphone number [phone] is a organization dedicated to helping individuals with developmental disabilities.     We are scheduling a video follow up with Dr. TiVenita Sheffieldor Feb 27, 2020     Prior to your visit:  1. Please follow the instructions in the Video Visit Patient Guide for your device.          To set up your smartphone or tablet: [email or web address]        To set up your your Mac or PC: [email or web address]. Prepare your smart phone, tablet, or computer by joining this test meeting: [email or web address]/test     On the day of your visit:  3. 5 minutes before your scheduled appointment, click the following link to start your meeting: [email or web address]     You will be placed in a virtual waiting room until the clinician joins the meeting.      By accepting this invitation you consent to hold this visit by video. A video visit is billed in the same way as an in-person clinic visit. The terms of your deductible and copy still apply. You always have the option to request an in-person appointment instead of, or following, a video visit.    If you have any questions, please call our [facility] [phone]  It was a pleasure assisting in your care.     Sincerely,     JoAlwyn PeaD  TiVenita SheffieldD              Patient Instructions    None       ______________________________________________    Discharge medications:     Medication List      START  taking these medications    escitalopram oxalate 5 mg tablet  Commonly known as: LEXAPRO  Take 1 tablet (5 mg total) by mouth daily for 3 days, THEN 1.5 tablets (7.5 mg total) daily for 3 days, THEN 2 tablets (10 mg total) daily for 24 days. Continue at this dose until you follow up with Dr. ShBobbye Charleston Start taking on: January 25, 2020        CONTINUE taking these medications    cholecalciferol (vitamin D3)  1,000 unit Cap     cyclobenzaprine 10 mg tablet  Commonly known as: FLEXERIL     rizatriptan 5 mg tablet  Commonly known as: MAXALT        STOP taking these medications    cloNIDine HCL 0.1 mg tablet  Commonly known as: CATAPRES     levETIRAcetam 500 mg tablet  Commonly known as: KEPPRA           Where to Get Your Medications      These medications were sent to [facility], [address]  [place], [place] [address]    Phone: [phone]    escitalopram oxalate 5 mg tablet       Attending Attestation - Day of Discharge Management    My date of service is 01/26/2020.    Final Physical Exam: No skin breakdown over electrode sites. Gait was without ataxia.     Brief Hospital Course  Patient was admitted for Video/EEG monitoring to characterize spells concerning for seizures and levetiracetam was stopped on admission to increase diagnostic yield. Multiple examples of her typical seizures were recorded, including the more violent shaking and foaming at the mouth. A persistent PDR was recorded during periods of unresponsiveness and semiology/characterization was most consistent with psychogenic nonepileptic spells. We had extensive discussions about diagnosis with patient, her husband and uncle (at her request). We also consulted Dr.Vivek Cy Blamer of psychiatry who recommended counseling that emphasized strategies to improve emotional expression, self advocacy and self care. Patient became more accepting of diagnosis during hospitalization and is motivated to get better. She will  follow up with myself by video visit, work on sleep hygiene to help with headache management, pursue physical therapy to manage chronic neck/back pain and start escitalopram. We will stop levetiracetam as no evidence for comorbid epilepsy. Lastly, we may pursue application to [facility].     I spent  45 minutes preparing discharge materials, prescriptions, follow up plans, and face-to-face time with the patient/family discussing sleep hygiene practices, follow up plans.     Cristine Polio, MD  01/26/2020

## 2020-01-26 NOTE — Discharge Instructions (Addendum)
[address]  Tel:  [phone]  [place] [address]  Fax: [phone]      Dear Robyn Henry,     This paperwork serves as your summary of your visit to [facility].     Brief Summary your hospital admission:  You were admitted to the Epilepsy Monitoring Unit (EMU) at the [facility] in order to further characterize your spells. While you were here, you had multiple spells that you told us were similar to those that you have at home, including face twitching, foaming at the mouth, and stiffening up. During all your typical spells there was no "epileptic" seizure activity on the brainwave test (EEG). 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    NES are very common: About one quarter of the patients coming through the epilepsy monitoring unit are diagnosed with NES. Being diagnosed with NES doesn't mean that these events are not real - in fact, these spells happen subconsciously. For many people, it is their body's way of telling them that there are unresolved emotional stressors or conflicts that have been piling up, usually over years or longer. This can be made worse for many people by adding new, other recent stressors.     How can I get treatment for these spells?  These spells are not dangerous for your brain, but they are stressful in-and-of themselves, and are important to have treated. Since NES are not epileptic seizures (meaning that they are not driven by abnormal electrical seizure activity in the brain), the treatment for your spells is not an anti-seizure drug, since they are unlikely to help these spells and could cause side effects. Instead, the treatment is dedicated therapy sessions, targeting your underlying emotional stressors in life and providing you strategies to address and take care of them in a healthy way. Many patients find that counseling in general helps, and meeting with your  psychiatrist to discuss this would be a great way to start targeting these spells to improve them and hopefully make them go away in the future. It would be helpful for your current therapist to discuss your care with Dr. Lalla Brothers (psychiatrist who met with you during the hospitalization). Please give Dr. Clabe Seal number [phone] (website: [email or web address]) to your therapist.     Medication instructions after discharge:  -Stop Levetiracetam. As this is a medicine that only treats epileptic seizures, not non-epileptic ones, we believe it is unlikely to be helpful and may be causing side effects  -Start escitalopram 5 mg daily for 3 days, then increase to 7.4m daily for 3 days, then increase to 179mdaily    We are also encouraging you to restart physical therapy to help you with your back and neck pain and reducing neck/back pain may help you with headaches.    Lastly, we talked about the possibility of you applying to be a client of the [employer]: htCartCleaning.huphone number [phone] is a organization dedicated to helping individuals with developmental disabilities.     We are scheduling a video follow up with Dr. TiVenita Sheffieldor Feb 27, 2020     Prior to your visit:  1. Please follow the instructions in the Video Visit Patient Guide for your device.          To set up your smartphone or tablet: [email or web address]        To set up  your your Mac or PC: [email or web address]  2. Prepare your smart phone, tablet, or computer by joining this test meeting: [email or web address].us/test     On the day of your visit:  3. 5 minutes before your scheduled appointment, click the following link to start your meeting: [email or web address]       You will be placed in a virtual waiting room until the clinician joins the meeting.      By accepting this invitation you consent to hold this  visit by video. A video visit is billed in the same way as an in-person clinic visit. The terms of your deductible and copy still apply. You always have the option to request an in-person appointment instead of, or following, a video visit.    If you have any questions, please call our [facility] at [phone]    It was a pleasure assisting in your care.     Sincerely,     Alwyn Pea MD  [name] MD

## 2020-01-26 NOTE — Telephone Encounter (Signed)
Refill for Levetiracetam was not sent in for patient as she was going to be admitted to [facility]. Per EMU admission notes:    We discussed treatment plan in detail:  1. Discontinue antiseizure medications  2. Start escitalopram 2.5 mg daily  3. Patient to pursue physical therapy to help with chronic neck and back pain  4. Hopefully we can eventually identify appropriate supportive therapy. Ideally, she would benefit from extensive DBT or psychodynamic therapy to help with emotional regulation and interpersonal relationship management  5. Follow up with me for longitudinal care

## 2020-01-27 DIAGNOSIS — F418 Other specified anxiety disorders: Secondary | ICD-10-CM

## 2020-02-27 ENCOUNTER — Ambulatory Visit: Admit: 2020-02-27 | Discharge: 2020-02-27 | Payer: MEDICAID | Attending: Neurology

## 2020-02-27 DIAGNOSIS — F445 Conversion disorder with seizures or convulsions: Secondary | ICD-10-CM

## 2020-02-27 DIAGNOSIS — G43009 Migraine without aura, not intractable, without status migrainosus: Secondary | ICD-10-CM

## 2020-02-27 DIAGNOSIS — F329 Major depressive disorder, single episode, unspecified: Secondary | ICD-10-CM

## 2020-02-27 MED ORDER — ESCITALOPRAM 5 MG TABLET
5 | ORAL | Status: DC
Start: 2020-02-27 — End: 2020-03-27

## 2020-02-27 MED ORDER — ESCITALOPRAM 10 MG TABLET
10 | ORAL_TABLET | Freq: Every day | ORAL | 6 refills | 90.00000 days | Status: DC
Start: 2020-02-27 — End: 2020-05-02

## 2020-02-27 NOTE — Progress Notes (Signed)
Subjective    Robyn Henry is a 39 y.o. female who presents with the following:    Chief Complaint   Patient presents with    Hospital Follow-up     Her husband Robyn Henry joined [place] briefly during video visit    History of Present Illness   She is experiencing frequent headaches.  Typical headache: tend to occur later in the afternoon (at around 4 pm). Bifrontal or holocephalic, mixed pain (sharp and squeezing). Nausea and sometimes vomits. Photophobia and has to go into a dark room. She has to stop activity and go to bed and sleeps for 2 hours and headache is less severe but she remains exhausted. This has been occurring about twice a week. She tries to catch it early and takes ibuprofen or tylenol as soon as the headache starts, but it doesn't help. She then takes Rizatriptan about an hour later.     She has been going to bed at 9:30-11 pm. She sometimes struggles to fall asleep and in the middle of the night, she can be tossing and turning at around 3 am and 5 am. She wakes up at 6:30 am, but feels still tired and not refreshed.     She is still experiencing nonepileptic seizures. She had a bad one last week. She was taking a parenting class and counseling session (divorce related). She felt something shaky in her head and then had the seizure in front of the other people in the class. It lasted more than 8-10 minutes. She had someone next to her held her and they stopped the class and they comforted her and she was exhausted immediately afterwards and stayed seated but couldn't really participate until about 20 minutes later.     She still has less severe, milder nonepileptic seizures, about twice a week.   Overall, she thinks she is doing better in this area.     She went to see her therapist and they talked about the hospitalization for Video/EEG but she has decided not to follow with him as they no longer have a therapeutic alliance    I reviewed the patient's pertinent allergies, medications medical history in  the electronic health record and made updates as appropriate.    Review of Systems      All other systems were reviewed and are negative.     Robyn Henry has No Known Allergies.    Medications the patient states to be taking prior to today's encounter.   Medication Sig    cholecalciferol, vitamin D3, 1,000 unit CAP     cyclobenzaprine (FLEXERIL) 10 mg tablet 10 mg 2 (two) times daily as needed for Muscle spasms       [EXPIRED] escitalopram oxalate (LEXAPRO) 5 mg tablet Take 1 tablet (5 mg total) by mouth daily for 3 days, THEN 1.5 tablets (7.5 mg total) daily for 3 days, THEN 2 tablets (10 mg total) daily for 24 days. Continue at this dose until you follow up with Dr. Kizzie Ide.    escitalopram oxalate (LEXAPRO) 5 mg tablet Take 10 mg by mouth daily    rizatriptan (MAXALT) 5 mg tablet        Assessment and Plan         Problem Based Assessment and Plan     Anxiety and depression  -     escitalopram oxalate (LEXAPRO) 10 mg tablet; Take 1.5 tablets (15 mg total) by mouth daily  Dispense: 45 tablet; Refill: 6    Psychogenic nonepileptic seizure  We  talked about role of therapist in helping her develop a way of processing and verbalizing her emotions. I encouraged her to try to establish care with a therapist and that this would be a primary goal before our next visit.     Migraine without aura and without status migrainosus, not intractable  We will increase Lexapro to see if this helps with migraine prevention  If she continues to have sleep issues, we may try nortryptiline next for migraine prevention  Continue headache and sleep diary  March 27, 2020 at 2 pm video appointment    I spent a total of 43 minutes on this patient's care on the day of their visit excluding time spent related to any billed procedures. This time includes time spent with the patient as well as time spent documenting in the medical record, reviewing patient's records and tests, obtaining history, placing orders, communicating with other healthcare  professionals, counseling the patient, family, or caregiver, and/or care coordination for the diagnoses above.      I performed this evaluation using real-time telehealth tools, including a live video Zoom connection between my location and the patient's location. Prior to initiating, the patient consented to perform this evaluation using telehealth tools.

## 2020-03-27 ENCOUNTER — Ambulatory Visit: Admit: 2020-03-27 | Discharge: 2020-03-27 | Payer: MEDICAID | Attending: Neurology

## 2020-03-27 DIAGNOSIS — G43009 Migraine without aura, not intractable, without status migrainosus: Secondary | ICD-10-CM

## 2020-03-27 DIAGNOSIS — F445 Conversion disorder with seizures or convulsions: Secondary | ICD-10-CM

## 2020-03-27 MED ORDER — PROPRANOLOL 40 MG TABLET
40 | ORAL_TABLET | Freq: Two times a day (BID) | ORAL | 11 refills | Status: DC
Start: 2020-03-27 — End: 2020-05-02

## 2020-03-27 MED ORDER — RIZATRIPTAN 10 MG TABLET
10 | ORAL_TABLET | Freq: Once | ORAL | 3 refills | Status: AC | PRN
Start: 2020-03-27 — End: ?

## 2020-03-27 MED ORDER — NAPROXEN 500 MG TABLET
500 | ORAL_TABLET | Freq: Once | ORAL | 3 refills | Status: AC | PRN
Start: 2020-03-27 — End: ?

## 2020-03-27 NOTE — Progress Notes (Signed)
Subjective    Robyn Henry is a 39 y.o. female who presents with the following:    Chief Complaint   Patient presents with    Headache     She was accompanied by her husband on this video visit  History of Present Illness   On average, she is  Having five migraine headaches per week.   Pounding pain at the vertex and has to go into a dark and quiet room and lie down and sleep for several hours.     Prior migraine prevention medications  Amitryptiline low dose but weight gain, never tried any other migraine prevention medications    She continues to have intermittent psychogenic seizures. She was recently taken to ER and given many doses of levetiracetam and lorazepam and was so "doped up" and her husband didn't think they understood what was different about her seizures and that they didn't differentiate epileptic from nonepileptic events. She continues to feel stressed about her son from previous marriage and when he comes over, there is a lot of conflict and disagreement and oppositional defiant behavior. She doesn't think her mood is better with escitalopram but her husband thinks she is less angry and more patient and more able to cope with stress.     I reviewed the patient's pertinent allergies, medications   in the electronic health record and made updates as appropriate.  Robyn Henry has No Known Allergies.    Medications the patient states to be taking prior to today's encounter.   Medication Sig    escitalopram oxalate (LEXAPRO) 10 mg tablet 20 mg daily       The patient is not driving.     Assessment and Plan         Problem Based Assessment and Plan     Migraine without aura and without status migrainosus, not intractable  We discussed migraine headache management in detail  We discussed the pros, cons, dose-related and idiosyncratic side effects of propranolol and amlodipine for prophylaxis and decided to start propranolol  We discussed abortive medications. She has used rizatriptan in past with success but  needs 10 mg. We will try adding naproxen sodium. I counseled that these abortive medications should be used sparingly, no more than once a week  -     propranoloL (INDERAL) 40 mg tablet; Take 1 tablet (40 mg total) by mouth Twice a day  Dispense: 60 tablet; Refill: 11  -     rizatriptan (MAXALT) 10 mg tablet; Take 1 tablet (10 mg total) by mouth once as needed for Migraine for up to 1 dose  Dispense: 9 tablet; Refill: 3  -     naproxen (NAPROSYN) 500 mg tablet; Take 1 tablet (500 mg total) by mouth once as needed (migraine) for up to 1 dose  Dispense: 20 tablet; Refill: 3    Psychogenic nonepileptic seizure  We will continue supportive counseling and continue to work on her verbalizing her emotions and needs. Continue esictalopram for depression       We will schedule video follow up for   May 02 2020 at 2 pm    I spent a total of 32 minutes on this patient's care on the day of their visit excluding time spent related to any billed procedures. This time includes time spent with the patient as well as time spent documenting in the medical record, reviewing patient's records and tests, obtaining history, placing orders, communicating with other healthcare professionals, counseling the patient, family, or caregiver, and/or  care coordination for the diagnoses above.    I performed this evaluation using real-time telehealth tools, including a live video Zoom connection between my location and the patient's location. Prior to initiating, the patient consented to perform this evaluation using telehealth tools.

## 2020-05-02 ENCOUNTER — Ambulatory Visit: Admit: 2020-05-02 | Discharge: 2020-05-02 | Payer: MEDICAID | Attending: Neurology

## 2020-05-02 DIAGNOSIS — G43009 Migraine without aura, not intractable, without status migrainosus: Secondary | ICD-10-CM

## 2020-05-02 MED ORDER — PROPRANOLOL 40 MG TABLET
40 | ORAL_TABLET | Freq: Four times a day (QID) | ORAL | 3 refills | Status: AC
Start: 2020-05-02 — End: ?

## 2020-05-02 MED ORDER — ESCITALOPRAM 20 MG TABLET
20 | ORAL_TABLET | Freq: Every day | ORAL | 11 refills | Status: AC
Start: 2020-05-02 — End: ?

## 2020-05-02 NOTE — Progress Notes (Signed)
Subjective    Robyn Henry is a 39 y.o. female who presents with the following:    Chief Complaint   Patient presents with    Follow-up       History of Present Illness   Patient continues to have nonepileptic seizures. Some of the seizures do appear to arise from "sleep" but unclear if she wakes up and is lying in bed and then has a seizure. Otherwise, the seizures look similar to the ones we recorded in the hospital and can be prolonged and wax and wane. I encouraged her husband to engage her during a seizure both through touch and sound. He has noticed that seizures are shorter in duration if she is on her side.     Headaches were better controlled overall until recently but it turns out that headaches returned in setting of patient not being adherent with propranolol and escitalopram (she went to see her mother and ?misplaced?both bottles). Her husband went to pharmacy try to pick up another bottle but they had filled the prescriptions just 10 days ago.  He had told her to restart escitalopram at 10 mg daily and then recently she increased to 15 mg daily, but had been taking 20 mg daily before recent confusion related to the move. The entire family is moving to [place] and she and the kids are leaving now so that they can begin enrolling them in school and her husband will be following in the next 1-2 months. They are relocating permanently and we talked about re-establishing health insurance (either applying for Medicaid, but may not qualify if state didn't expand Medicaid coverage through [employer], versus applying for [employer] through her husband).     I reviewed the patient's pertinent allergies, medications social history in the electronic health record and made updates as appropriate.  [name] has No Known Allergies.    Medications the patient states to be taking prior to today's encounter.   Medication Sig    cholecalciferol, vitamin D3, 1,000 unit CAP     naproxen (NAPROSYN) 500 mg tablet Take 1 tablet  (500 mg total) by mouth once as needed (migraine) for up to 1 dose    rizatriptan (MAXALT) 10 mg tablet Take 1 tablet (10 mg total) by mouth once as needed for Migraine for up to 1 dose     escitalopram oxalate (LEXAPRO) 10 mg tablet Take 1.5 tablets (15 mg total) by mouth daily (Patient taking differently: Take 20 mg by mouth daily   )    ropranoloL (INDERAL) 40 mg tablet Take 1 tablet (40 mg total) by mouth Twice a day          Assessment and Plan         Problem Based Assessment and Plan     Migraine without aura and without status migrainosus, not intractable  I resent prescriptions with new instructions so that she can fill her prescriptions before she leaves for [facility]. She knows that she should be taking propranolol 40 mg bid and escitalopram 20 mg daily.   -     escitalopram oxalate (LEXAPRO) 20 mg tablet; Take 1 tablet (20 mg total) by mouth daily One tablet daily as directed  Dispense: 30 tablet; Refill: 11  -     propranoloL (INDERAL) 40 mg tablet; Take 1 tablet (40 mg total) by mouth 4 (four) times daily  Dispense: 120 tablet; Refill: 3    I spent a total of 18 minutes on this patient's care on the  day of their visit excluding time spent related to any billed procedures. This time includes time spent with the patient as well as time spent documenting in the medical record, reviewing patient's records and tests, obtaining history, placing orders, communicating with other healthcare professionals, counseling the patient, family, or caregiver, and/or care coordination for the diagnoses above.    I performed this evaluation using real-time telehealth tools, including a live video Zoom connection between my location and the patient's location. Prior to initiating, the patient consented to perform this evaluation using telehealth tools.

## 2020-06-07 NOTE — Telephone Encounter (Signed)
Answered to page.    Called and spoke pt who noticed transient rashes on the neck this past Sunday 8/22. It disappeared after a couple of hours. Then again she noticed it on her elbows and then on legs. Now on her wrist. Some appear like blisters. Will upload a pic via my chart.    Denies any fever, chills, shortness of breath or difficulty swallowing.  Denies any bugs or animal exposure or laundry detergent changes. Has moved in with mother in law since a month.     Due to the itchiness causing her to sleep less and now having her "episodes" at night.    Described two episodes by her mother in law (a paramedic)    #1 Episode  Eyes rolling back, both arms shaking, legs shaking, no awareness - lasting 30 min. Shallow breathing and low pulse - 2 weeks ago    #2 Episode  Lips moving, eyes blinking, weak, had to be assisted to the ground, in and out of it. Back to baseline - 15 20 min.Marland Kitchenexhausted. has to sleep for an hour afterward.    No convulsions.    Not on ASMs      Started Melatonin 10 mg nighty for sleep aid.    Patient believes Lexapro is causing her to have the rashes.    Recommendations:  -Stop Lexapro for now and see if rashes disappear  -Take 25 mg of Benadryl for itching/ May repeat X1  -Ok to take OTC Melatonin 10 mg Nightly as sleep aid  -Upload a photo of the rashes  -Consult with Dr. Kizzie Ide  -Go to ED for life threatening convulsions or if not back to baseline after episodes.  -Call back for follow up in a week if no change.    Copied from CRM [id number]. Topic: EPIL - Advice  >> Jun 07, 2020  1:01 PM Haskell Riling wrote:  GENERAL TEMPLATE    PATIENT NAME: Robyn Henry  DATE OF BIRTH: Sep 19, 1981  MRN: [id number]    PHONE NUMBER:    [phone] (Pt's spouse)  ALTERNATE PHONE NUMBER: [phone] (Patient)  LEAVING A MESSAGE OK?:    Yes    INSURANCE INFORMATION:  PAYOR: [employer]   PLAN: [facility]    MESSAGE / PROBLEM:    Patient called stating that she thinks the Lexapro is  causing patient to have rashes. Patient states that the rashes make patient not able to sleep at night and causes more episodes. Patient is requesting a call back to discuss next steps    Agent to page  MESSAGE GENERATED BY [facility]  CRM NUMBER [id number] CREATED BY:    Haskell Riling,     06/07/2020,      1:01 PM  >> Jun 07, 2020  1:08 PM Haskell Riling wrote:  Transmitting...  Please wait while we are transmitting your message to the network providers. A message confirmation will appear shortly.   Message Confirmation  We confirmed that your messages were SENT WITHOUT error.  However, we CANNOT confirm that the message was RECEIVED by the device. You should request a callback for important messages.  Messages sent to 1 recipient(s).    1 page(s) CONFIRMED sent.

## 2020-06-08 ENCOUNTER — Encounter (HOSPITAL_COMMUNITY): Payer: Self-pay | Admitting: Internal Medicine

## 2020-06-08 ENCOUNTER — Inpatient Hospital Stay (HOSPITAL_COMMUNITY)
Admission: EM | Admit: 2020-06-08 | Discharge: 2020-06-10 | DRG: 101 | Disposition: A | Discharge status: Home / Self Care | Payer: Medicaid - Out of State | Attending: Internal Medicine | Admitting: Internal Medicine

## 2020-06-08 ENCOUNTER — Other Ambulatory Visit: Payer: Self-pay

## 2020-06-08 ENCOUNTER — Emergency Department (HOSPITAL_COMMUNITY): Payer: Medicaid - Out of State

## 2020-06-08 DIAGNOSIS — Z79899 Other long term (current) drug therapy: Secondary | ICD-10-CM | POA: Diagnosis not present

## 2020-06-08 DIAGNOSIS — G4089 Other seizures: Secondary | ICD-10-CM | POA: Diagnosis present

## 2020-06-08 DIAGNOSIS — H90A21 Sensorineural hearing loss, unilateral, right ear, with restricted hearing on the contralateral side: Secondary | ICD-10-CM | POA: Diagnosis present

## 2020-06-08 DIAGNOSIS — Z20822 Contact with and (suspected) exposure to covid-19: Secondary | ICD-10-CM | POA: Diagnosis present

## 2020-06-08 DIAGNOSIS — R569 Unspecified convulsions: Secondary | ICD-10-CM | POA: Diagnosis present

## 2020-06-08 DIAGNOSIS — Z8249 Family history of ischemic heart disease and other diseases of the circulatory system: Secondary | ICD-10-CM | POA: Diagnosis not present

## 2020-06-08 DIAGNOSIS — Z8669 Personal history of other diseases of the nervous system and sense organs: Secondary | ICD-10-CM

## 2020-06-08 DIAGNOSIS — F419 Anxiety disorder, unspecified: Secondary | ICD-10-CM | POA: Diagnosis present

## 2020-06-08 DIAGNOSIS — I69354 Hemiplegia and hemiparesis following cerebral infarction affecting left non-dominant side: Secondary | ICD-10-CM

## 2020-06-08 DIAGNOSIS — Z974 Presence of external hearing-aid: Secondary | ICD-10-CM

## 2020-06-08 DIAGNOSIS — Z87898 Personal history of other specified conditions: Secondary | ICD-10-CM

## 2020-06-08 HISTORY — DX: Conversion disorder with seizures or convulsions: F44.5

## 2020-06-08 HISTORY — DX: Presence of external hearing-aid: Z97.4

## 2020-06-08 HISTORY — DX: Unspecified hearing loss, right ear: H91.91

## 2020-06-08 HISTORY — DX: Unspecified convulsions: R56.9

## 2020-06-08 LAB — I-STAT BETA HCG BLOOD, ED (MC, WL, AP ONLY): I-stat hCG, quantitative: <5 m[IU]/mL (ref <5)

## 2020-06-08 LAB — COMPREHENSIVE METABOLIC PANEL
ALT: 31 U/L (ref 0–44)
AST: 23 U/L (ref 15–41)
Albumin: 4.1 g/dL (ref 3.5–5.0)
Alkaline Phosphatase: 44 U/L (ref 38–126)
Anion gap: 9 (ref 5–15)
BUN: 19 mg/dL (ref 6–20)
CO2: 25 mmol/L (ref 22–32)
Calcium: 9 mg/dL (ref 8.9–10.3)
Chloride: 104 mmol/L (ref 98–111)
Creatinine, Ser: 0.91 mg/dL (ref 0.44–1.00)
GFR calc Af Amer: >60 mL/min (ref >60)
GFR calc non Af Amer: >60 mL/min (ref >60)
Glucose, Bld: 94 mg/dL (ref 70–99)
Potassium: 4.2 mmol/L (ref 3.5–5.1)
Sodium: 138 mmol/L (ref 135–145)
Total Bilirubin: 0.8 mg/dL (ref 0.3–1.2)
Total Protein: 7 g/dL (ref 6.5–8.1)

## 2020-06-08 LAB — URINALYSIS, ROUTINE W REFLEX MICROSCOPIC
Bilirubin Urine: NEGATIVE
Glucose, UA: NEGATIVE mg/dL
Hgb urine dipstick: NEGATIVE
Ketones, ur: NEGATIVE mg/dL
Leukocytes,Ua: NEGATIVE
Nitrite: NEGATIVE
Protein, ur: NEGATIVE mg/dL
Specific Gravity, Urine: 1.013 (ref 1.005–1.030)
pH: 7 (ref 5.0–8.0)

## 2020-06-08 LAB — CBC WITH DIFFERENTIAL/PLATELET
Abs Immature Granulocytes: 0.03 10*3/uL (ref 0.00–0.07)
Basophils Absolute: 0.1 10*3/uL (ref 0.0–0.1)
Basophils Relative: 1 %
Eosinophils Absolute: 0.3 10*3/uL (ref 0.0–0.5)
Eosinophils Relative: 3 %
HCT: 42.5 % (ref 36.0–46.0)
Hemoglobin: 14.2 g/dL (ref 12.0–15.0)
Immature Granulocytes: 0 %
Lymphocytes Relative: 26 %
Lymphs Abs: 2.1 10*3/uL (ref 0.7–4.0)
MCH: 30.5 pg (ref 26.0–34.0)
MCHC: 33.4 g/dL (ref 30.0–36.0)
MCV: 91.4 fL (ref 80.0–100.0)
Monocytes Absolute: 0.5 10*3/uL (ref 0.1–1.0)
Monocytes Relative: 6 %
Neutro Abs: 5.2 10*3/uL (ref 1.7–7.7)
Neutrophils Relative %: 64 %
Platelets: 247 10*3/uL (ref 150–400)
RBC: 4.65 MIL/uL (ref 3.87–5.11)
RDW: 12.1 % (ref 11.5–15.5)
WBC: 8.1 10*3/uL (ref 4.0–10.5)
nRBC: 0 % (ref 0.0–0.2)

## 2020-06-08 LAB — MAGNESIUM: Magnesium: 2.1 mg/dL (ref 1.7–2.4)

## 2020-06-08 LAB — SARS CORONAVIRUS 2 BY RT PCR (HOSPITAL ORDER, PERFORMED IN ~~LOC~~ HOSPITAL LAB): SARS Coronavirus 2: NEGATIVE

## 2020-06-08 MED ORDER — ENOXAPARIN SODIUM 40 MG/0.4ML ~~LOC~~ SOLN
40.0000 mg | SUBCUTANEOUS | Status: DC
  Administered 2020-06-08 – 2020-06-09 (×2): 40 mg via SUBCUTANEOUS
  Filled 2020-06-08 (×2): qty 0.4

## 2020-06-08 MED ORDER — SODIUM CHLORIDE 0.9 % IV SOLN
75.0000 mL/h | INTRAVENOUS | Status: DC
  Administered 2020-06-08 – 2020-06-09 (×2): 75 mL/h via INTRAVENOUS

## 2020-06-08 MED ORDER — AMMONIA AROMATIC IN INHA
0.3000 mL | Freq: Once | RESPIRATORY_TRACT | Status: DC
  Filled 2020-06-08: qty 10

## 2020-06-08 MED ORDER — SODIUM CHLORIDE 0.9 % IV SOLN
INTRAVENOUS | Status: DC | PRN
  Administered 2020-06-08: 250 mL via INTRAVENOUS

## 2020-06-08 MED ORDER — LORAZEPAM 2 MG/ML IJ SOLN
1.0000 mg | INTRAMUSCULAR | Status: DC | PRN
  Administered 2020-06-09: 2 mg via INTRAVENOUS
  Filled 2020-06-08 (×4): qty 1

## 2020-06-08 MED ORDER — LORAZEPAM 2 MG/ML IJ SOLN
INTRAMUSCULAR | Status: AC
  Administered 2020-06-08: 2 mg
  Filled 2020-06-08: qty 1

## 2020-06-08 MED ORDER — LEVETIRACETAM IN NACL 1000 MG/100ML IV SOLN
1000.0000 mg | Freq: Once | INTRAVENOUS | Status: AC
  Administered 2020-06-08: 1000 mg via INTRAVENOUS
  Filled 2020-06-08: qty 100

## 2020-06-08 MED ORDER — LEVETIRACETAM IN NACL 500 MG/100ML IV SOLN
500.0000 mg | Freq: Two times a day (BID) | INTRAVENOUS | Status: DC
  Administered 2020-06-08: 500 mg via INTRAVENOUS
  Filled 2020-06-08: qty 100

## 2020-06-08 MED ORDER — LEVETIRACETAM IN NACL 1000 MG/100ML IV SOLN: 1000.0000 mg | Freq: Two times a day (BID) | INTRAVENOUS | Status: DC

## 2020-06-08 MED ORDER — SODIUM CHLORIDE 0.9 % IV BOLUS
1000.0000 mL | Freq: Once | INTRAVENOUS | Status: AC
  Administered 2020-06-08: 1000 mL via INTRAVENOUS

## 2020-06-08 NOTE — ED Notes (Addendum)
Pt with witnessed seizure lasting appx 2 min, notified by pts mother that pt was actively seizing. Padded side rails already in place. Suction setup at bedside. EDP Tegeler aware and at bedside.

## 2020-06-08 NOTE — ED Provider Notes (Signed)
[facility]   CSN: [id number] Arrival date & time: 06/08/20  2725     History Chief Complaint  Patient presents with  . Seizures    Melanie Allen is a 39 y.o. female.  The history is provided by the patient, medical records and the EMS personnel. No language interpreter was used.  Seizures Seizure activity on arrival: no   Seizure type:  Grand mal Preceding symptoms comment:  Face and eye twitching Initial focality:  Facial Episode characteristics: abnormal movements, combativeness and generalized shaking   Episode characteristics: no incontinence and no tongue biting   Postictal symptoms: confusion and somnolence   Return to baseline: yes   Severity:  Severe Duration:  5 minutes Timing:  Clustered Number of seizures this episode:  3 since yesterday Progression:  Unchanged Context: decreased sleep   Context: not alcohol withdrawal and not change in medication   Recent head injury:  No recent head injuries PTA treatment:  Midazolam History of seizures: yes   Seizure control level:  Uncontrolled Current therapy:  None      No past medical history on file.  There are no problems to display for this patient.    OB History   No obstetric history on file.     No family history on file.  Social History   Tobacco Use  . Smoking status: Not on file  Substance Use Topics  . Alcohol use: Not on file  . Drug use: Not on file    Home Medications Prior to Admission medications   Not on File    Allergies    Patient has no allergy information on record.  Review of Systems   Review of Systems  Constitutional: Negative for chills, diaphoresis, fatigue and fever.  HENT: Negative for congestion.   Eyes: Negative for photophobia and visual disturbance.  Respiratory: Negative for cough, chest tightness, shortness of breath and wheezing.   Cardiovascular: Negative for chest pain, palpitations and leg swelling.    Gastrointestinal: Positive for nausea. Negative for abdominal pain, constipation, diarrhea and vomiting.  Genitourinary: Positive for frequency. Negative for dysuria.  Musculoskeletal: Negative for back pain, neck pain and neck stiffness.  Skin: Negative for rash (rash reported but not seen) and wound.  Neurological: Positive for seizures, weakness (at baseline in left arm) and headaches. Negative for dizziness, light-headedness and numbness.  Psychiatric/Behavioral: Negative for agitation and confusion.  All other systems reviewed and are negative.   Physical Exam Updated Vital Signs BP (!) 89/67 (BP Location: Right Arm)   Pulse 60   Temp 98 F (36.7 C) (Oral)   Resp 16   SpO2 100%   Physical Exam Vitals and nursing note reviewed.  Constitutional:      General: She is not in acute distress.    Appearance: She is well-developed. She is not ill-appearing, toxic-appearing or diaphoretic.  HENT:     Head: Normocephalic and atraumatic.     Right Ear: External ear normal.     Left Ear: External ear normal.     Nose: Nose normal. No congestion or rhinorrhea.     Mouth/Throat:     Mouth: Mucous membranes are dry.     Pharynx: No oropharyngeal exudate or posterior oropharyngeal erythema.  Eyes:     Extraocular Movements: Extraocular movements intact.     Conjunctiva/sclera: Conjunctivae normal.     Pupils: Pupils are equal, round, and reactive to light.  Cardiovascular:     Rate and Rhythm: Normal rate.  Pulses: Normal pulses.     Heart sounds: No murmur heard.   Pulmonary:     Effort: Pulmonary effort is normal. No respiratory distress.     Breath sounds: No stridor. No wheezing, rhonchi or rales.  Chest:     Chest wall: No tenderness.  Abdominal:     General: Abdomen is flat. There is no distension.     Tenderness: There is no abdominal tenderness. There is no right CVA tenderness, left CVA tenderness, guarding or rebound.  Musculoskeletal:        General: No  tenderness.     Cervical back: Normal range of motion and neck supple. No tenderness.     Right lower leg: No edema.     Left lower leg: No edema.  Skin:    General: Skin is warm.     Capillary Refill: Capillary refill takes less than 2 seconds.     Coloration: Skin is not pale.     Findings: No erythema or rash.  Neurological:     Mental Status: She is alert and oriented to person, place, and time. Mental status is at baseline.     GCS: GCS eye subscore is 4. GCS verbal subscore is 5. GCS motor subscore is 6.     Cranial Nerves: No dysarthria or facial asymmetry.     Sensory: No sensory deficit.     Motor: Weakness present. No abnormal muscle tone or seizure activity (none seen here on arrivl).     Deep Tendon Reflexes: Reflexes are normal and symmetric.     Comments: Some mild weakness in left grip strength compared to right.  No other focal neurologic deficits on my initial exam.  Psychiatric:        Mood and Affect: Mood normal.     ED Results / Procedures / Treatments   Labs (all labs ordered are listed, but only abnormal results are displayed) Labs Reviewed  SARS CORONAVIRUS 2 BY RT PCR (HOSPITAL ORDER, PERFORMED IN [facility])  URINE CULTURE  CBC WITH DIFFERENTIAL/PLATELET  COMPREHENSIVE METABOLIC PANEL  URINALYSIS, ROUTINE W REFLEX MICROSCOPIC  MAGNESIUM  I-STAT BETA HCG BLOOD, ED ([facility], [facility], [facility] ONLY)    EKG None  Radiology CT Head Wo Contrast  Result Date: 06/08/2020 CLINICAL DATA:  Seizure.  Now with headache. EXAM: CT HEAD WITHOUT CONTRAST TECHNIQUE: Contiguous axial images were obtained from the base of the skull through the vertex without intravenous contrast. COMPARISON:  None FINDINGS: Brain: No evidence of acute infarction, hemorrhage, hydrocephalus, extra-axial collection or mass lesion/mass effect. Vascular: No hyperdense vessel or unexpected calcification. Skull: Normal. Negative for fracture or focal lesion. Sinuses/Orbits: No acute finding.  Other: None. IMPRESSION: No acute intracranial abnormalities. Normal brain. Electronically Signed   By: Signa Kell M.D.   On: 06/08/2020 07:59    Procedures Procedures (including critical care time)  CRITICAL CARE Performed by: Canary Brim Breane Grunwald Total critical care time: 30 minutes Critical care time was exclusive of separately billable procedures and treating other patients. Recurrent seizure needing ativan to terminate.  Critical care was necessary to treat or prevent imminent or life-threatening deterioration. Critical care was time spent personally by me on the following activities: development of treatment plan with patient and/or surrogate as well as nursing, discussions with consultants, evaluation of patient's response to treatment, examination of patient, obtaining history from patient or surrogate, ordering and performing treatments and interventions, ordering and review of laboratory studies, ordering and review of radiographic studies, pulse oximetry and re-evaluation  of patient's condition.   Medications Ordered in ED Medications  levETIRAcetam (KEPPRA) IVPB 1000 mg/100 mL premix (has no administration in time range)  sodium chloride 0.9 % bolus 1,000 mL (0 mLs Intravenous Stopped 06/08/20 1032)  LORazepam (ATIVAN) 2 MG/ML injection (2 mg  Given 06/08/20 6283)    ED Course  I have reviewed the triage vital signs and the nursing notes.  Pertinent labs & imaging results that were available during my care of the patient were reviewed by me and considered in my medical decision making (see chart for details).    MDM Rules/Calculators/A&P                          Melanie Allen is a 39 y.o. female with a past medical history significant for prior stroke with residual left-sided weakness, history of seizures and pseudoseizures who presents with seizures.  According to EMS, patient had one seizure yesterday lasting several minutes and then had 2 seizures this morning.  The  procedure was witnessed by family and lasted around 5 minutes.  EMS arrived and during transport she had another 5-minute seizure requiring 5 of Versed for termination.  He reports that this was not a pseudoseizure and the patient did not blink when he touched her eyeball.  Patient was also drooling and having generalized grand mal seizure.  Patient was then postictal for a period of time during transport.  Patient says that she has had some mild rash which her PCP thinks was related to recent initiation of Lexapro.  She says that this rash on her arm and back were itching significantly causing her to have decreased sleep and she thinks this could have contributed to her recurrent seizures.  She does not take any seizure medication as previously her team thought this was more pseudoseizure than actual seizures.  Patient reports he recently moved from [place] to [place] and has not gotten established with a PCP or neurologist here.  She reports otherwise she has had some urinary frequency and darkened urine.  She denies any fevers, chills, general cough.  She does report some nausea no vomiting.  She reports no constipation or diarrhea.  She is not vaccinated for Covid.  She denies any trauma.  She reports a mild headache right now but otherwise denies any vision changes or speech difficulties.   EMS reports her glucose was 99.   On exam, she does have some mild weakness in her left arm compared to right with grip strength.  Legs were symmetric in strength.  Sensation symmetric throughout.  Symmetric smile.  Pupils are symmetric reactive normal extraocular movements.  No rashes seen on my initial exam.  Lungs were clear and chest and abdomen were nontender.  Given the patient's history of stroke, mild headache, and 3 seizures in the last 24 hours, we will get a CT of the head to make sure there is no hemorrhagic conversion or other abnormalities intracranially.  We will get labs including urinalysis  given her urinary changes with frequency.  Anticipate reassessment and some fluids.  Anticipate discussion with neurology to discuss further management in regards to antiepileptic medications and if patient will need admission for EEG or further work-up.  9:34 AM Patient just had a 5-minute seizure that was generalized tonic-clonic and shaking all over.  Patient was given Ativan as it was terminating.  Mother arrived at the bedside and reports that she had several seizures this morning before EMS got  there so she has had at least 4 seizures today and the one yesterday.  We will call neurology for further recommendations.  CT head did not show any bleed or acute abnormality.  Electrolytes overall reassuring initially.  Urinalysis has not yet been collected and Covid test has not returned.  10:13 AM After her seizure here, I called neurology.  They did recommend loading with Keppra and admission to [facility] for EEG today.  We will call for admission for her and load her with Keppra as they recommended.  Covid test is still in process and urinalysis is being collected now.  She will be admitted for further management of recurrent seizures.   Final Clinical Impression(s) / ED Diagnoses Final diagnoses:  Seizure (HCC)     Clinical Impression: 1. Seizure [facility](HCC)     Disposition: Admit  This note was prepared with assistance of Dragon voice recognition software. Occasional wrong-word or sound-a-like substitutions may have occurred due to the inherent limitations of voice recognition software.     Oshen Wlodarczyk, Canary Brimhristopher J, MD 06/08/20 1116

## 2020-06-08 NOTE — ED Notes (Signed)
[facility] notified of need for transport. °

## 2020-06-08 NOTE — Progress Notes (Addendum)
Patient arrived to 3W rm 7 alert and oriented x4 with delayed responses and drowsiness. On initial assessment RN noticed patient"s eyes rolling back in head, eye lids fluttering and right side lip twitching. Patient still able to follow commands. Episode lasting approx. 1.5 min. Seizure-like activity resolved without intervention.

## 2020-06-08 NOTE — ED Triage Notes (Signed)
Pt BIBA from home.   Per EMS- Pt with 2 witnessed seizures today.  1 witnessed by family 3-5 min, 1 witnessed by EMS 3-5 min. Post ictal with EMS. Also had seizure yesterday.   IM 5 mg versed given   Hx of stroke Pt reports new rashes.  Recently started taking lexapro.   HOH

## 2020-06-08 NOTE — Consult Note (Addendum)
Neurology Consultation  Reason for Consult: Seizures  Referring Physician: Dr. Rhona Leavens  CC: Seizures  History is obtained from: Chart  HPI: Melanie Allen is a 39 y.o. 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  Patient was noted to have one episode witnessed by family members yesterday and 2 episodes prior to arrival to the emergency department at [facility] today.  While in the emergency department at [facility] she apparently had seizure activity that lasted 2 minutes which resolved with Ativan.  CT of head was unremarkable.  Patient was loaded with Keppra.    While at [facility] emergency department patient had reported she had recently moved from [place] to [place] however she has not gotten established with PCP or neurologist.  She reported otherwise she has had some urinary frequency.  She also reported that she does have a history of pseudoseizures along with seizures.  Patient has been placed on Keppra in the past however she states that this did not help with her seizures.  Upon arriving at [facility], once on the floor nurse noted a seizure-like episode.  This was described as patient's eyes fluttering, then tapping her index finger and thumb for approximately 1 minute however she was talking during this episode.  Once activity resolved she was back to baseline.  On consultation with patient she was alert and oriented, she was able to follow commands.  She was slow with response however when prompted stronger she clearly was able to follow commands to a faster extent.  Semiology of the seizure event precipitating presentation to the ED is as follows: Sudden onset of left > right limb jerking lasting 4 minutes in conjunction with unawareness of environmental cues,  followed by gradual decrease in amplitude and frequency, then postictal confusion and fatigue. The patient has no memory of the event, which was witnessed by family.    Past Medical History:  Diagnosis Date  . Deafness in right ear   . Pseudoseizure   . Seizure (HCC)   . Wears hearing aid in left ear     Family History  Problem Relation Age of Onset  . Hypertension Mother   . Hypertension Father    Social History:   reports that she has never smoked. She has never used smokeless tobacco. She reports that she does not drink alcohol and does not use drugs.  Home Medications: No current facility-administered medications on file prior to encounter.   Current Outpatient Medications on File Prior to Encounter  Medication Sig Dispense Refill  . cholecalciferol (VITAMIN D3) 25 MCG (1000 UNIT) tablet Take 1,000 Units by mouth daily.    . cyclobenzaprine (FLEXERIL) 10 MG tablet Take 10 mg by mouth 2 (two) times daily as needed for muscle spasms.    . diphenhydrAMINE (BENADRYL ALLERGY) 25 MG tablet Take 25 mg by mouth every 6 (six) hours as needed for itching.    . Melatonin 10 MG TABS Take 1 tablet by mouth at bedtime as needed (for sleep).    . phenazopyridine (PYRIDIUM) 100 MG tablet Take 100 mg by mouth 3 (three) times daily after meals. PRN    . propranolol (INDERAL) 40 MG tablet Take 40 mg by mouth 4 (four) times daily.       Inpatient Medications  Current Facility-Administered Medications:  .  0.9 %  sodium chloride infusion, 75 mL/hr, Intravenous, Continuous, Jerald Kief, MD, Last Rate: 75 mL/hr at 06/08/20 1720, 75 mL/hr at 06/08/20 1720 .  ammonia inhalant 0.3 mL, 0.3 mL, Inhalation, Once, Ulice Dash, PA-C .  enoxaparin (LOVENOX) injection 40 mg, 40 mg, Subcutaneous, Q24H, Jerald Kief, MD .  Melene Muller ON 06/09/2020] levETIRAcetam (KEPPRA) IVPB 1000 mg/100 mL premix, 1,000 mg, Intravenous, Q12H, Jerald Kief, MD .  LORazepam (ATIVAN) injection 1-2 mg, 1-2 mg,  Intravenous, Q2H PRN, Jerald Kief, MD  ROS:    General ROS: negative for - chills, fatigue, fever, night sweats, weight gain or weight loss Psychological ROS: negative for - behavioral disorder, hallucinations, memory difficulties, mood swings or suicidal ideation Ophthalmic ROS: negative for - blurry vision, double vision, eye pain or loss of vision ENT ROS: negative for - epistaxis, nasal discharge, oral lesions, sore throat, tinnitus or vertigo Allergy and Immunology ROS: negative for - hives or itchy/watery eyes Hematological and Lymphatic ROS: negative for - bleeding problems, bruising or swollen lymph nodes Endocrine ROS: negative for - galactorrhea, hair pattern changes, polydipsia/polyuria or temperature intolerance Respiratory ROS: negative for - cough, hemoptysis, shortness of breath or wheezing Cardiovascular ROS: negative for - chest pain, dyspnea on exertion, edema or irregular heartbeat Gastrointestinal ROS: negative for - abdominal pain, diarrhea, hematemesis, nausea/vomiting or stool incontinence Genito-Urinary ROS: negative for - dysuria, hematuria, incontinence or urinary frequency/urgency Musculoskeletal ROS: negative for - joint swelling or muscular weakness Neurological ROS: as noted in HPI Dermatological ROS: negative for rash and skin lesion changes  Exam: Current vital signs: BP 98/65 (BP Location: Right Arm)   Pulse (!) 55   Temp 97.7 F (36.5 C) (Oral)   Resp 16   SpO2 95%  Vital signs in last 24 hours: Temp:  [97.7 F (36.5 C)-98 F (36.7 C)] 97.7 F (36.5 C) (08/27 1632) Pulse Rate:  [55-63] 55 (08/27 1632) Resp:  [13-20] 16 (08/27 1632) BP: (89-102)/(63-74) 98/65 (08/27 1632) SpO2:  [95 %-100 %] 95 % (08/27 1632)   Constitutional: Appears well-developed and well-nourished.  Psych: Affect appropriate to situation Eyes: No scleral injection HENT: No OP obstrucion Head: Normocephalic.  Cardiovascular: Normal rate and regular rhythm.   Respiratory: Effort normal, non-labored breathing GI: Soft.  No distension. There is no tenderness.  Skin: WDI  Neuro: Mental Status: Patient is awake, with decreased level of alertness, oriented to person, place, month, year, and situation. Speech-hypophonic but shows no dysarthria, aphasia.  She is able to name, repeat.  She is able to follow commands without difficulty but is bradykinetic with increased latencies of motor and verbal responses.  Cranial Nerves: II: Visual Fields are full.  III,IV, VI: EOMI without ptosis or diploplia. Pupils equal, round and reactive to light V: Facial sensation is symmetric to temperature VII: Facial movement is symmetric.  VIII: hearing is intact to voice X: Palat elevates symmetrically XI: Shoulder shrug is symmetric. XII: tongue is midline without atrophy or fasciculations.  Patient has no tongue bite on either vermilion border Motor: Tone is normal. Bulk is normal. 5/5 strength was present in all four extremities.  No drift in upper extremities.  Lower extremities she has full strength and when prompted would hold her legs up without drift. Sensory: Sensation is symmetric to light touch and temperature in the arms and legs. Deep Tendon Reflexes: 2+ and symmetric in the biceps and patellae.  Plantars: Toes are downgoing bilaterally.  Cerebellar: FNF intact bilaterally  Labs I have reviewed labs in  epic and the results pertinent to this consultation are:   CBC    Component Value Date/Time   WBC 8.1 06/08/2020 0827   RBC 4.65 06/08/2020 0827   HGB 14.2 06/08/2020 0827   HCT 42.5 06/08/2020 0827   PLT 247 06/08/2020 0827   MCV 91.4 06/08/2020 0827   MCH 30.5 06/08/2020 0827   MCHC 33.4 06/08/2020 0827   RDW 12.1 06/08/2020 0827   LYMPHSABS 2.1 06/08/2020 0827   MONOABS 0.5 06/08/2020 0827   EOSABS 0.3 06/08/2020 0827   BASOSABS 0.1 06/08/2020 0827    CMP     Component Value Date/Time   NA 138 06/08/2020 0827   K 4.2  06/08/2020 0827   CL 104 06/08/2020 0827   CO2 25 06/08/2020 0827   GLUCOSE 94 06/08/2020 0827   BUN 19 06/08/2020 0827   CREATININE 0.91 06/08/2020 0827   CALCIUM 9.0 06/08/2020 0827   PROT 7.0 06/08/2020 0827   ALBUMIN 4.1 06/08/2020 0827   AST 23 06/08/2020 0827   ALT 31 06/08/2020 0827   ALKPHOS 44 06/08/2020 0827   BILITOT 0.8 06/08/2020 0827   GFRNONAA >60 06/08/2020 0827   GFRAA >60 06/08/2020 0827    Lipid Panel  No results found for: CHOL, TRIG, HDL, CHOLHDL, VLDL, LDLCALC, LDLDIRECT   Imaging I have reviewed the images obtained:  CT-scan of the brain-no acute intracranial abnormalities.   Felicie Morn PA-C [facility] [phone] 06/08/2020, 5:22 PM    Assessment: 39 year old female with a history of congenital deafness from fetal CMV infection who recently moved from [place] to [place].  She has not at this time established a PCP or neurologist.  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 Her first spell was at age 12 while watching a movie. She has seizures that can be precipitated by specific environmental and tactile stimuli. She has been on Keppra in the past but did not feel this was of any efficacy.  Patient has had multiple seizures at home and while in the ED over the last few days.  Upon arriving to the ED she had a seizure-like episode during which she was able to talk throughout, follow commands, had eyes fluttering and tapping her right thumb and index finger with no postictal phase.  Patient did not have any tongue biting or urinary incontinence. She states that she has had 2 MRIs in the past, one of which was described to her as being positive for "white spots" on the left side of her brain. The other MRI was described to her as normal.   1. Semiology of the seizure event precipitating presentation to the ED is as follows: Sudden onset of left > right limb jerking lasting 4 minutes in  conjunction with unawareness of environmental cues, followed by gradual decrease in amplitude and frequency, then postictal confusion and fatigue. The patient has no memory of the event, which was witnessed by family.  2. CT head: Normal.  3. Keppra 1000 mg IV x 1 was given in the [facility] ED.   Recommendations: -Inpatient seizure precautions -Hold off on additional Keppra doses -LTM EEG in the AM. -Ativan 1 mg if she has a seizure lasting more than 5 minutes and call neurologist prior to administration. --MRI brain with and without contrast - seizure protocol --Outpatient seizure precautions: Per [facility] statutes, patients with seizures are not allowed to drive until  they have been seizure-free for six months. Use caution when using heavy  equipment or power tools. Avoid working on ladders or at heights. Take showers instead of baths. Ensure the water temperature is not too high on the home water heater. Do not go swimming alone. When caring for infants or small children, sit down when holding, feeding, or changing them to minimize risk of injury to the child in the event you have a seizure. Also, Maintain good sleep hygiene. Avoid alcohol. --Discussed with Dr. Melynda RippleYadav  I have seen and examined the patient. I have formulated the assessment and recommendations. 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 Here for seizure recurrence. Not on an anticonvulsant at home. Plan is for LTM in the AM.  Electronically signed: Dr. Caryl PinaEric Rosalba Totty

## 2020-06-08 NOTE — H&P (Signed)
History and Physical    Melanie Allen HAL:[id number] DOB: April 07, 1981 DOA: 06/08/2020  PCP: Patient, No Pcp Per  Patient coming from: Home  Chief Complaint: Seizures  HPI: Melanie Allen is a 38 y.o. female with medical history significant of seizures and prior history of pseudoseizures who presents to emergency department following multiple episodes of witnessed seizure events.  Patient noted to have 1 episode witnessed by family members yesterday and then 2 more episodes prior to arrival to ED on day of hospital visit.  Of note, patient was recently started on Lexapro prescribed by PCP.  Of note, patient is currently postictal, unable to provide her own history.  Patient's mother is at bedside  ED Course: In the emergency department, patient had a witnessed seizure event lasting approximately 2 minutes, resolved with Ativan.  CT head was performed and was found to be unremarkable.  EDP discussed case with neurology who recommended loading patient with Keppra.  Hospital service was consulted for consideration for medical admission.  Review of Systems:  Review of Systems  Unable to perform ROS: Mental acuity    Past medical history of seizures and pseudoseizures  Social history cannot be obtained as patient currently postictal  Not on File  No family history on file.  Unable to obtain as patient is currently postictal  Prior to Admission medications   Not on File    Physical Exam: Vitals:   06/08/20 0714 06/08/20 0731 06/08/20 0900 06/08/20 1030  BP:   97/69 92/63  Pulse:   (!) 58 (!) 57  Resp:   13 16  Temp:      TempSrc:      SpO2: 96% 96% 98% 99%    Constitutional: NAD, calm, comfortable Vitals:   06/08/20 0714 06/08/20 0731 06/08/20 0900 06/08/20 1030  BP:   97/69 92/63  Pulse:   (!) 58 (!) 57  Resp:   13 16  Temp:      TempSrc:      SpO2: 96% 96% 98% 99%   Eyes: PERRL, lids and conjunctivae normal ENMT: Mucous membranes are moist. Posterior pharynx clear of any  exudate or lesions.Normal dentition.  Neck: normal, supple, no masses, no thyromegaly Respiratory: clear to auscultation bilaterally, no wheezing, no crackles. Normal respiratory effort. No accessory muscle use.  Cardiovascular: Regular rate and rhythm, S1, S2 Abdomen: no tenderness, no masses palpated. No hepatosplenomegaly. Bowel sounds positive.  Musculoskeletal: no clubbing / cyanosis. No joint deformity upper and lower extremities. Good ROM, no contractures. Normal muscle tone.  Skin: no rashes, lesions, ulcers. No induration Neurologic: CN 2-12 grossly intact. Sensation intact, strength appears intact, no active seizure at present Psychiatric: Unable to obtain as patient currently postictal   Labs on Admission: I have personally reviewed following labs and imaging studies  CBC: Recent Labs  Lab 06/08/20 0827  WBC 8.1  NEUTROABS 5.2  HGB 14.2  HCT 42.5  MCV 91.4  PLT 247   Basic Metabolic Panel: Recent Labs  Lab 06/08/20 0827  NA 138  K 4.2  CL 104  CO2 25  GLUCOSE 94  BUN 19  CREATININE 0.91  CALCIUM 9.0  MG 2.1   GFR: CrCl cannot be calculated (Unknown ideal weight.). Liver Function Tests: Recent Labs  Lab 06/08/20 0827  AST 23  ALT 31  ALKPHOS 44  BILITOT 0.8  PROT 7.0  ALBUMIN 4.1   No results for input(s): LIPASE, AMYLASE in the last 168 hours. No results for input(s): AMMONIA in the last 168 hours.  Coagulation Profile: No results for input(s): INR, PROTIME in the last 168 hours. Cardiac Enzymes: No results for input(s): CKTOTAL, CKMB, CKMBINDEX, TROPONINI in the last 168 hours. BNP (last 3 results) No results for input(s): PROBNP in the last 8760 hours. HbA1C: No results for input(s): HGBA1C in the last 72 hours. CBG: No results for input(s): GLUCAP in the last 168 hours. Lipid Profile: No results for input(s): CHOL, HDL, LDLCALC, TRIG, CHOLHDL, LDLDIRECT in the last 72 hours. Thyroid Function Tests: No results for input(s): TSH, T4TOTAL,  FREET4, T3FREE, THYROIDAB in the last 72 hours. Anemia Panel: No results for input(s): VITAMINB12, FOLATE, FERRITIN, TIBC, IRON, RETICCTPCT in the last 72 hours. Urine analysis:    Component Value Date/Time   COLORURINE YELLOW 06/08/2020 1012   APPEARANCEUR CLEAR 06/08/2020 1012   LABSPEC 1.013 06/08/2020 1012   PHURINE 7.0 06/08/2020 1012   GLUCOSEU NEGATIVE 06/08/2020 1012   HGBUR NEGATIVE 06/08/2020 1012   BILIRUBINUR NEGATIVE 06/08/2020 1012   KETONESUR NEGATIVE 06/08/2020 1012   PROTEINUR NEGATIVE 06/08/2020 1012   NITRITE NEGATIVE 06/08/2020 1012   LEUKOCYTESUR NEGATIVE 06/08/2020 1012   Sepsis Labs: !!!!!!!!!!!!!!!!!!!!!!!!!!!!!!!!!!!!!!!!!!!! @LABRCNTIP (procalcitonin:4,lacticidven:4) ) Recent Results (from the past 240 hour(s))  SARS Coronavirus 2 by RT PCR (hospital order, performed in [facility] hospital lab) Nasopharyngeal Nasopharyngeal Swab     Status: None   Collection Time: 06/08/20  8:27 AM   Specimen: Nasopharyngeal Swab  Result Value Ref Range Status   SARS Coronavirus 2 NEGATIVE NEGATIVE Final    Comment: (NOTE) SARS-CoV-2 target nucleic acids are NOT DETECTED.  The SARS-CoV-2 RNA is generally detectable in upper and lower respiratory specimens during the acute phase of infection. The lowest concentration of SARS-CoV-2 viral copies this assay can detect is 250 copies / mL. A negative result does not preclude SARS-CoV-2 infection and should not be used as the sole basis for treatment or other patient management decisions.  A negative result may occur with improper specimen collection / handling, submission of specimen other than nasopharyngeal swab, presence of viral mutation(s) within the areas targeted by this assay, and inadequate number of viral copies (<250 copies / mL). A negative result must be combined with clinical observations, patient history, and epidemiological information.  Fact Sheet for Patients:    06/10/20  Fact Sheet for Healthcare Providers: BoilerBrush.com.cy  This test is not yet approved or  cleared by the https://pope.com/ FDA and has been authorized for detection and/or diagnosis of SARS-CoV-2 by FDA under an Emergency Use Authorization (EUA).  This EUA will remain in effect (meaning this test can be used) for the duration of the COVID-19 declaration under Section 564(b)(1) of the Act, 21 U.S.C. section 360bbb-3(b)(1), unless the authorization is terminated or revoked sooner.  Performed at [facility], [address], [place], [place]      Radiological Exams on Admission: CT Head Wo Contrast  Result Date: 06/08/2020 CLINICAL DATA:  Seizure.  Now with headache. EXAM: CT HEAD WITHOUT CONTRAST TECHNIQUE: Contiguous axial images were obtained from the base of the skull through the vertex without intravenous contrast. COMPARISON:  None FINDINGS: Brain: No evidence of acute infarction, hemorrhage, hydrocephalus, extra-axial collection or mass lesion/mass effect. Vascular: No hyperdense vessel or unexpected calcification. Skull: Normal. Negative for fracture or focal lesion. Sinuses/Orbits: No acute finding. Other: None. IMPRESSION: No acute intracranial abnormalities. Normal brain. Electronically Signed   By: 06/10/2020 M.D.   On: 06/08/2020 07:59    Assessment/Plan Principal Problem:   Seizure (HCC) Active  Problems:   History of pseudoseizure   1. Witnessed seizures 1. Patient with to pull witnessed seizures (1 seizure noted by family yesterday, 2 seizures noted by family prior to visit today and then 1 witnessed seizure while in ED) 2. CT head reviewed, unremarkable 3. EDP did discuss case with neurology who recommends loading patient with Keppra 4. We will continue Keppra 1 g twice daily 5. Transfer to [facility] for further neurologic work-up 6. We will continue as needed Ativan   7. Repeat CBC and basic metabolic panel in the morning 2. Prior hx pseudoseizures 1. Per above, admit patient to [facility] for seizure work-up  DVT prophylaxis: Lovenox subq  Code Status: Full Family Communication: Pt in room  Disposition Plan:   Consults called: Neurology Admission status: Inpatient as will likely require greater than 2 midnight stay to work-up seizure disorder  Rickey Barbara MD [facility] Pager On Amion  If 7PM-7AM, please contact night-coverage  06/08/2020, 10:42 AM

## 2020-06-09 ENCOUNTER — Inpatient Hospital Stay (HOSPITAL_COMMUNITY): Payer: Medicaid - Out of State

## 2020-06-09 ENCOUNTER — Ambulatory Visit (HOSPITAL_COMMUNITY): Payer: PRIVATE HEALTH INSURANCE

## 2020-06-09 LAB — URINE CULTURE

## 2020-06-09 LAB — GLUCOSE, CAPILLARY: Glucose-Capillary: 156 mg/dL — ABNORMAL HIGH (ref 70–99)

## 2020-06-09 MED ORDER — MELATONIN 5 MG PO TABS: 10.0000 mg | ORAL_TABLET | Freq: Every evening | ORAL | Status: DC | PRN

## 2020-06-09 MED ORDER — PROPRANOLOL HCL 40 MG PO TABS
40.0000 mg | ORAL_TABLET | Freq: Four times a day (QID) | ORAL | Status: DC
  Administered 2020-06-09 – 2020-06-10 (×2): 40 mg via ORAL
  Filled 2020-06-09 (×5): qty 1

## 2020-06-09 MED ORDER — GADOBUTROL 1 MMOL/ML IV SOLN
7.5000 mL | Freq: Once | INTRAVENOUS | Status: AC | PRN
  Administered 2020-06-09: 7.5 mL via INTRAVENOUS

## 2020-06-09 NOTE — Progress Notes (Signed)
At about 0142, Pt's call  Light went off, I went in, Pt was did not respond  to me, rather her right upper lip and right leg were  twitching. Vital signs were stable in less than 5 mins the twitching stopped, there was no intervention, shortly after that the twitching resumed,  lasted for about 20 secs and stopped. MD on call was notified. He suggested we inform the neurologist on call

## 2020-06-09 NOTE — Progress Notes (Signed)
PROGRESS NOTE  Melanie Allen NGE:[id number] DOB: 1981/05/03 DOA: 06/08/2020 PCP: Patient, No Pcp Per  Brief History   Melanie Allen is a 39 y.o. female with medical history significant of seizures and prior history of pseudoseizures who presents to emergency department following multiple episodes of witnessed seizure events.  Patient noted to have 1 episode witnessed by family members yesterday and then 2 more episodes prior to arrival to ED on day of hospital visit.  Of note, patient was recently started on Lexapro prescribed by PCP.  Of note, patient is currently postictal, unable to provide her own history.  Patient's mother is at bedside  ED Course: In the emergency department, patient had a witnessed seizure event lasting approximately 2 minutes, resolved with Ativan.  CT head was performed and was found to be unremarkable.  EDP discussed case with neurology who recommended loading patient with Keppra.  Hospital service was consulted for consideration for medical admission.  Neurology has been consulted. The patient has had three witnessed episodes of seizure like activity since admission. vLTM EEG is in place.  Consultants  . Neurology  Procedures  . vLTM EEG  Interval History/Subjective  I visit the patient a few minutes after her most recent episode of seizure-like activity. She is busy talking on the phone. No new complaints.  Objective   Vitals:  Vitals:   06/09/20 0911 06/09/20 1218  BP: 105/72 105/84  Pulse: 84 73  Resp: 20 20  Temp: 98.5 F (36.9 C) 98.4 F (36.9 C)  SpO2: 100% 99%   Exam:  Constitutional:  . The patient is awake, alert, and oriented x 3. No acute distress. Eyes:  . pupils and irises appear normal . Normal lids and conjunctivae . No nystagmus Respiratory:  . No increased work of breathing. . No wheezes, rales, or rhonchi . No tactile fremitus Cardiovascular:  . Regular rate and rhythm . No murmurs, ectopy, or gallups. . No lateral PMI. No  thrills. Abdomen:  . Abdomen is soft, non-tender, non-distended . No hernias, masses, or organomegaly . Normoactive bowel sounds.  Musculoskeletal:  . No cyanosis, clubbing, or edema Skin:  . No rashes, lesions, ulcers . palpation of skin: no induration or nodules Neurologic:  . CN 2-12 intact . Sensation all 4 extremities intact . No apparent post-ictal phase. Psychiatric:  . Mental status o Mood, affect appropriate o Orientation to person, place, time  . judgment and insight appear intact   I have personally reviewed the following:   Today's Data  . Dispensing optician  . Urine culture: polymicrobial/contaminated . UA negative  Imaging  . CT head: No acute abnormalities. Marland Kitchen MRI Brain: No acute abnormalities  Scheduled Meds: . ammonia  0.3 mL Inhalation Once  . enoxaparin (LOVENOX) injection  40 mg Subcutaneous Q24H   Continuous Infusions: . sodium chloride Stopped (06/08/20 2247)    Principal Problem:   Seizure (HCC) Active Problems:   History of pseudoseizure   LOS: 1 day   A & P  Witnessed seizures: Patient with total of 4 witnessed seizure prior to admission and three since. She has been loaded with keppra. CT head and MRI brain are negative for acute abnormality.   History of pseudoseizures: Noted.   I have seen and examined this patient myself. I have spent 32 minutes in her evaluation and care.  Status is: Inpatient  Remains inpatient appropriate because:Ongoing diagnostic testing needed not appropriate for outpatient work up  Dispo: The patient is from: Home  Anticipated d/c is to: Home              Anticipated d/c date is: 1 day              Patient currently is not medically stable to d/c.  Tymarion Everard, DO [facility] Direct contact: see [email or web address]  7PM-7AM contact night coverage as above 06/09/2020, 1:49 PM  LOS: 1 day

## 2020-06-09 NOTE — Plan of Care (Signed)
Notified by RN of patient having 1 minute of seizure-like activity that self resolved. Concern for seizure versus pseudoseizure pending LTM placement in the morning. No new recommendations at this time   Milon Dikes MD On-call Neurohospitalist

## 2020-06-09 NOTE — Progress Notes (Signed)
vLTM EEG started. Educated mom on event button. Notified Neuro

## 2020-06-09 NOTE — Progress Notes (Signed)
Subjective: Spot EEG has been completed. LTM leads have been placed.   Objective: Current vital signs: BP 105/72 (BP Location: Left Arm)   Pulse 84   Temp 98.5 F (36.9 C) (Oral)   Resp 20   SpO2 100%  Vital signs in last 24 hours: Temp:  [97.7 F (36.5 C)-98.7 F (37.1 C)] 98.5 F (36.9 C) (08/28 0911) Pulse Rate:  [55-84] 84 (08/28 0911) Resp:  [16-20] 20 (08/28 0911) BP: (83-105)/(59-78) 105/72 (08/28 0911) SpO2:  [95 %-100 %] 100 % (08/28 0911)  Intake/Output from previous day: 08/27 0701 - 08/28 0700 In: 1904.7 [P.O.:120; I.V.:622.3; IV Piggyback:1162.4] Out: -  Intake/Output this shift: No intake/output data recorded. Nutritional status:  Diet Order            Diet regular Room service appropriate? Yes; Fluid consistency: Thin  Diet effective now                HEENT: EEG leads in place Lungs: Respirations unlabored Skin: No rash on face or upper extremities  Neurologic Exam: Ment: Awake and alert. Speech fluent with intact comprehension.  CN: Tracks normally. Eyes conjugate. Face symmetric. Phonation intact.  Motor: Unremarkable Cerebellar: No ataxia noted.   Lab Results: Results for orders placed or performed during the hospital encounter of 06/08/20 (from the past 48 hour(s))  CBC with Differential     Status: None   Collection Time: 06/08/20  8:27 AM  Result Value Ref Range   WBC 8.1 4.0 - 10.5 K/uL   RBC 4.65 3.87 - 5.11 MIL/uL   Hemoglobin 14.2 12.0 - 15.0 g/dL   HCT 16.1 36 - 46 %   MCV 91.4 80.0 - 100.0 fL   MCH 30.5 26.0 - 34.0 pg   MCHC 33.4 30.0 - 36.0 g/dL   RDW 09.6 04.5 - 40.9 %   Platelets 247 150 - 400 K/uL   nRBC 0.0 0.0 - 0.2 %   Neutrophils Relative % 64 %   Neutro Abs 5.2 1.7 - 7.7 K/uL   Lymphocytes Relative 26 %   Lymphs Abs 2.1 0.7 - 4.0 K/uL   Monocytes Relative 6 %   Monocytes Absolute 0.5 0 - 1 K/uL   Eosinophils Relative 3 %   Eosinophils Absolute 0.3 0 - 0 K/uL   Basophils Relative 1 %   Basophils Absolute 0.1 0 -  0 K/uL   Immature Granulocytes 0 %   Abs Immature Granulocytes 0.03 0.00 - 0.07 K/uL    Comment: Performed at [facility], [address], [place], [place] [address]  Comprehensive metabolic panel     Status: None   Collection Time: 06/08/20  8:27 AM  Result Value Ref Range   Sodium 138 135 - 145 mmol/L   Potassium 4.2 3.5 - 5.1 mmol/L   Chloride 104 98 - 111 mmol/L   CO2 25 22 - 32 mmol/L   Glucose, Bld 94 70 - 99 mg/dL    Comment: Glucose reference range applies only to samples taken after fasting for at least 8 hours.   BUN 19 6 - 20 mg/dL   Creatinine, Ser 4.78 0.44 - 1.00 mg/dL   Calcium 9.0 8.9 - 29.5 mg/dL   Total Protein 7.0 6.5 - 8.1 g/dL   Albumin 4.1 3.5 - 5.0 g/dL   AST 23 15 - 41 U/L   ALT 31 0 - 44 U/L   Alkaline Phosphatase 44 38 - 126 U/L   Total Bilirubin 0.8 0.3 - 1.2 mg/dL  GFR calc non Af Amer >60 >60 mL/min   GFR calc Af Amer >60 >60 mL/min   Anion gap 9 5 - 15    Comment: Performed at [facility], [address], [place], [place] [address]  SARS Coronavirus 2 by RT PCR (hospital order, performed in [facility] hospital lab) Nasopharyngeal Nasopharyngeal Swab     Status: None   Collection Time: 06/08/20  8:27 AM   Specimen: Nasopharyngeal Swab  Result Value Ref Range   SARS Coronavirus 2 NEGATIVE NEGATIVE    Comment: (NOTE) SARS-CoV-2 target nucleic acids are NOT DETECTED.  The SARS-CoV-2 RNA is generally detectable in upper and lower respiratory specimens during the acute phase of infection. The lowest concentration of SARS-CoV-2 viral copies this assay can detect is 250 copies / mL. A negative result does not preclude SARS-CoV-2 infection and should not be used as the sole basis for treatment or other patient management decisions.  A negative result may occur with improper specimen collection / handling, submission of specimen other than nasopharyngeal swab, presence of viral mutation(s) within the areas  targeted by this assay, and inadequate number of viral copies (<250 copies / mL). A negative result must be combined with clinical observations, patient history, and epidemiological information.  Fact Sheet for Patients:   BoilerBrush.com.cy  Fact Sheet for Healthcare Providers: https://pope.com/  This test is not yet approved or  cleared by the [place] FDA and has been authorized for detection and/or diagnosis of SARS-CoV-2 by FDA under an Emergency Use Authorization (EUA).  This EUA will remain in effect (meaning this test can be used) for the duration of the COVID-19 declaration under Section 564(b)(1) of the Act, 21 U.S.C. section 360bbb-3(b)(1), unless the authorization is terminated or revoked sooner.  Performed at [facility], [address], [place], [place] [address]   Magnesium     Status: None   Collection Time: 06/08/20  8:27 AM  Result Value Ref Range   Magnesium 2.1 1.7 - 2.4 mg/dL    Comment: Performed at [facility], [address], [place], [place] [address]  I-Stat Beta hCG blood, ED ([facility], [facility], [facility] only)     Status: None   Collection Time: 06/08/20  8:32 AM  Result Value Ref Range   I-stat hCG, quantitative <5.0 <5 mIU/mL   Comment 3            Comment:   GEST. AGE      CONC.  (mIU/mL)   <=1 WEEK        5 - 50     2 WEEKS       50 - 500     3 WEEKS       100 - 10,000     4 WEEKS     1,000 - 30,000        FEMALE AND NON-PREGNANT FEMALE:     LESS THAN 5 mIU/mL   Urinalysis, Routine w reflex microscopic     Status: None   Collection Time: 06/08/20 10:12 AM  Result Value Ref Range   Color, Urine YELLOW YELLOW   APPearance CLEAR CLEAR   Specific Gravity, Urine 1.013 1.005 - 1.030   pH 7.0 5.0 - 8.0   Glucose, UA NEGATIVE NEGATIVE mg/dL   Hgb urine dipstick NEGATIVE NEGATIVE   Bilirubin Urine NEGATIVE NEGATIVE   Ketones, ur NEGATIVE NEGATIVE mg/dL   Protein, ur  NEGATIVE NEGATIVE mg/dL   Nitrite NEGATIVE NEGATIVE   Leukocytes,Ua  NEGATIVE NEGATIVE    Comment: Performed at [facility], [address], [place], [place] [address]  Urine culture     Status: Abnormal   Collection Time: 06/08/20 10:12 AM   Specimen: Urine, Clean Catch  Result Value Ref Range   Specimen Description      URINE, CLEAN CATCH Performed at [facility], [address], [place], [place] [address]    Special Requests      NONE Performed at [facility], [address] [place], [place] [address]    Culture MULTIPLE SPECIES PRESENT, SUGGEST RECOLLECTION (A)    Report Status 06/09/2020 FINAL   Glucose, capillary     Status: Abnormal   Collection Time: 06/09/20  9:12 AM  Result Value Ref Range   Glucose-Capillary 156 (H) 70 - 99 mg/dL    Comment: Glucose reference range applies only to samples taken after fasting for at least 8 hours.    Recent Results (from the past 240 hour(s))  SARS Coronavirus 2 by RT PCR (hospital order, performed in [facility] hospital lab) Nasopharyngeal Nasopharyngeal Swab     Status: None   Collection Time: 06/08/20  8:27 AM   Specimen: Nasopharyngeal Swab  Result Value Ref Range Status   SARS Coronavirus 2 NEGATIVE NEGATIVE Final    Comment: (NOTE) SARS-CoV-2 target nucleic acids are NOT DETECTED.  The SARS-CoV-2 RNA is generally detectable in upper and lower respiratory specimens during the acute phase of infection. The lowest concentration of SARS-CoV-2 viral copies this assay can detect is 250 copies / mL. A negative result does not preclude SARS-CoV-2 infection and should not be used as the sole basis for treatment or other patient management decisions.  A negative result may occur with improper specimen collection / handling, submission of specimen other than nasopharyngeal swab, presence of viral mutation(s) within the areas targeted by this assay, and inadequate number of  viral copies (<250 copies / mL). A negative result must be combined with clinical observations, patient history, and epidemiological information.  Fact Sheet for Patients:   BoilerBrush.com.cy  Fact Sheet for Healthcare Providers: https://pope.com/  This test is not yet approved or  cleared by the [place] FDA and has been authorized for detection and/or diagnosis of SARS-CoV-2 by FDA under an Emergency Use Authorization (EUA).  This EUA will remain in effect (meaning this test can be used) for the duration of the COVID-19 declaration under Section 564(b)(1) of the Act, 21 U.S.C. section 360bbb-3(b)(1), unless the authorization is terminated or revoked sooner.  Performed at [facility], [address] [place], [place] [address]   Urine culture     Status: Abnormal   Collection Time: 06/08/20 10:12 AM   Specimen: Urine, Clean Catch  Result Value Ref Range Status   Specimen Description   Final    URINE, CLEAN CATCH Performed at [facility], [address], [place], [place] [address]    Special Requests   Final    NONE Performed at [facility], [address] [place], [place] [address]    Culture MULTIPLE SPECIES PRESENT, SUGGEST RECOLLECTION (A)  Final   Report Status 06/09/2020 FINAL  Final    Lipid Panel No results for input(s): CHOL, TRIG, HDL, CHOLHDL, VLDL, LDLCALC in the last 72 hours.  Studies/Results: CT Head Wo Contrast  Result Date: 06/08/2020 CLINICAL DATA:  Seizure.  Now with headache. EXAM: CT HEAD WITHOUT CONTRAST TECHNIQUE: Contiguous axial images were obtained from  the base of the skull through the vertex without intravenous contrast. COMPARISON:  None FINDINGS: Brain: No evidence of acute infarction, hemorrhage, hydrocephalus, extra-axial collection or mass lesion/mass effect. Vascular: No hyperdense vessel or unexpected calcification. Skull:  Normal. Negative for fracture or focal lesion. Sinuses/Orbits: No acute finding. Other: None. IMPRESSION: No acute intracranial abnormalities. Normal brain. Electronically Signed   By: Signa Kellaylor  Stroud M.D.   On: 06/08/2020 07:59   MR BRAIN W WO CONTRAST  Result Date: 06/09/2020 CLINICAL DATA:  Seizure EXAM: MRI HEAD WITHOUT AND WITH CONTRAST TECHNIQUE: Multiplanar, multiecho pulse sequences of the brain and surrounding structures were obtained without and with intravenous contrast. CONTRAST:  7.1085mL GADAVIST GADOBUTROL 1 MMOL/ML IV SOLN COMPARISON:  None. FINDINGS: Brain: There is no acute infarction or intracranial hemorrhage. There is no intracranial mass, mass effect, or edema. There is no hydrocephalus or extra-axial fluid collection. Ventricles and sulci are normal in size and configuration. A few small foci no T2 hyperintensity in the supratentorial white matter likely reflect nonspecific gliosis/demyelination of doubtful clinical significance. No abnormal enhancement. Vascular: Major vessel flow voids at the skull base are preserved. Skull and upper cervical spine: Normal marrow signal is preserved. Sinuses/Orbits: Minor mucosal thickening.  Orbits are unremarkable. Other: Sella is unremarkable.  Mastoid air cells are clear. IMPRESSION: No significant abnormality. Electronically Signed   By: Guadlupe SpanishPraneil  Patel M.D.   On: 06/09/2020 07:01    Medications:  Scheduled: . ammonia  0.3 mL Inhalation Once  . enoxaparin (LOVENOX) injection  40 mg Subcutaneous Q24H   Continuous: . sodium chloride 75 mL/hr (06/09/20 0812)  . 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  She has not at this time established a PCP or neurologist.  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 Her first spell was at age 39 while watching a  movie. She has seizures that can be precipitated by specific environmental and tactile stimuli. She has been on Keppra in the past but did not feel this was of any efficacy.  Patient has had multiple seizures at home and while in the ED over the last few days.  Upon arriving to the ED she had a seizure-like episode during which she was able to talk throughout, follow commands, had eyes fluttering and tapping her right thumb and index finger with no postictal phase.  Patient did not have any tongue biting or urinary incontinence. She states that she has had 2 MRIs in the past, one of which was described to her as being positive for "white spots" on the left side of her brain. The other MRI was described to her as normal.   1. Semiology of the seizure event precipitating presentation to the ED is as follows: Sudden onset of left > right limb jerking lasting 4 minutes in conjunction with unawareness of environmental cues, followed by gradual decrease in amplitude and frequency, then postictal confusion and fatigue. The patient has no memory of the event, which was witnessed by family.  2. CT head: Normal.  3. Keppra 1000 mg IV x 1 [facility]. Now off anticonvulsant medication.  4. Of note, she has had two of her "mild" spells while on LTM today.   Recommendations: -Inpatient seizure precautions -Hold off on additional Keppra doses -Ativan 1 mg if she has a seizure lasting more than 5  minutes and call neurologist prior to administration. --MRI brain with and without contrast after LTM is completed - seizure protocol --Continue LTM. The patient states that her seizures can be triggered by flashing lights. I have asked the EEG technician to arrange for photic stimulation tomorrow during LTM EEG maintenance.     LOS: 1 day   @Electronically  signed: Dr. 06/09/2020  9:25 AM

## 2020-06-09 NOTE — Procedures (Signed)
Patient Name: Melanie Allen  MRN: [id number]  Epilepsy Attending: Charlsie Quest  Referring Physician/Provider: Dr Caryl Pina Duration: 06/09/2020 4132 to 06/10/2020 4401  Patient history: 39yo F with seizure like episodes. EEG to evaluate for seizure.  Level of alertness: Awake, asleep  AEDs during EEG study: None  Technical aspects: This EEG study was done with scalp electrodes positioned according to the 10-20 International system of electrode placement. Electrical activity was acquired at a sampling rate of 500Hz  and reviewed with a high frequency filter of 70Hz  and a low frequency filter of 1Hz . EEG data were recorded continuously and digitally stored.   Description: The posterior dominant rhythm consists of 9 Hz activity of moderate voltage (25-35 uV) seen predominantly in posterior head regions, symmetric and reactive to eye opening and eye closing. Sleep was characterized by vertex waves, sleep spindles (12 to 14 Hz), maximal frontocentral region.     Event button was pressed on 06/09/2020 at 1845 for eye flutter and lip twitching. Concomitant eeg change before, during and after the event didn't show any eeg change to suggest seizure  Event button was pressed on 06/09/2020 at 1049, 1207, 1756 for unclear reasons. Concomitant eeg change before, during and after the event didn't show any eeg change to suggest seizure   IMPRESSION: This study is within normal limits. No seizures or epileptiform discharges were seen throughout the recording.  Event button was pressed on 06/09/2020 for eyelid flutter and lip twitching without concomitant eeg change and was not epileptic   Karine Garn 06/11/2020

## 2020-06-09 NOTE — Progress Notes (Signed)
Pt's call light went off, RN went to assess pt and found her currently having seizure like activity with foam coming out of mouth. Having R upper lip twitching movements. R arm and R leg twtiching as well. Episode lasted for 5 mins and resolved on its own. Pt returned back to baseline after episode and aware of what happened. Pt vital signs are stable. MD Swayze made aware. MD Lindzen paged and made aware.

## 2020-06-10 MED ORDER — PROPRANOLOL HCL 40 MG PO TABS
40.0000 mg | ORAL_TABLET | Freq: Two times a day (BID) | ORAL | Status: DC
  Filled 2020-06-10: qty 1

## 2020-06-10 MED ORDER — PROPRANOLOL HCL 40 MG PO TABS: 40.0000 mg | ORAL_TABLET | Freq: Two times a day (BID) | ORAL | Status: AC

## 2020-06-10 NOTE — Procedures (Addendum)
Patient Name: Melanie Allen  MRN: [id number]  Epilepsy Attending: Charlsie Quest  Referring Physician/Provider: Dr Caryl Pina Duration: 06/10/2020 2876 to 06/10/2020 1251  Patient history: 39yo F with seizure like episodes. EEG to evaluate for seizure.  Level of alertness: Awake  AEDs during EEG study: None  Technical aspects: This EEG study was done with scalp electrodes positioned according to the 10-20 International system of electrode placement. Electrical activity was acquired at a sampling rate of 500Hz  and reviewed with a high frequency filter of 70Hz  and a low frequency filter of 1Hz . EEG data were recorded continuously and digitally stored.   Description: The posterior dominant rhythm consists of 9 Hz activity of moderate voltage (25-35 uV) seen predominantly in posterior head regions, symmetric and reactive to eye opening and eye closing. Sleep was characterized by vertex waves, sleep spindles (12 to 14 Hz), maximal frontocentral region.     Event button was pressed on 06/10/2020 at 1235. Nurse rubbed patient 's back which per patient is know to trigger her spells. Patient was then noted have eye flutter, not responding or following commands. Concomitant eeg change before, during and after the event showed normal posterior dominant rhythm and didn't show any eeg change to suggest seizure  Event button was pressed on 06/10/2020 at 1018 for unclear reasons. Concomitant eeg change before, during and after the event didn't show any eeg change to suggest seizure   IMPRESSION: This study is within normal limits. No seizures or epileptiform discharges were seen throughout the recording.  Event button was pressed on 06/10/2020 as described above without concomitant eeg change and was not epileptic  Melanie Allen 06/12/2020

## 2020-06-10 NOTE — Discharge Summary (Signed)
Physician Discharge Summary  Jacy Brocker FAO:[id number] DOB: 1981-07-04 DOA: 06/08/2020  PCP: Patient, No Pcp Per  Admit date: 06/08/2020 Discharge date: 06/10/2020  Recommendations for Outpatient Follow-up:  1. Discharge to home. 2. No driving until no seizures x 6 months. 3. Follow up with PCP as arranged for by Transitions of Care Team in 7-10 days 4. Follow up with psychiatry as arranged for by Transitions of Care Team in 2-4 weeks.  Discharge Diagnoses: Principal diagnosis is #1 1. Non-epileptic seizures 2. Anxiety  Discharge Condition: Fair  Disposition: Home  Diet recommendation: Regular  There were no vitals filed for this visit.  History of present illness:  Melanie Allen is a 39 y.o. female with medical history significant of seizures and prior history of pseudoseizures who presents to emergency department following multiple episodes of witnessed seizure events.  Patient noted to have 1 episode witnessed by family members yesterday and then 2 more episodes prior to arrival to ED on day of hospital visit.  Of note, patient was recently started on Lexapro prescribed by PCP.  Of note, patient is currently postictal, unable to provide her own history.  Patient's mother is at bedside  ED Course: In the emergency department, patient had a witnessed seizure event lasting approximately 2 minutes, resolved with Ativan.  CT head was performed and was found to be unremarkable.  EDP discussed case with neurology who recommended loading patient with Keppra.  Hospital service was consulted for consideration for medical admission.  Hospital Course: Neurology has been consulted. The patient has had three witnessed episodes of seizure like activity since admission. vLTM EEG was performed. The patient is found to have non-epileptic seizures. She has been cleared for discharge by neurology. She is recommended to follow up with psychiatry as outpatient. Transitions of care have been consulted to  arrange follow for the patient with psychiatry and a primary care doctor.  Today's assessment: S: The patient is resting comfortably. No new complaints. O: Vitals:  Vitals:   06/10/20 1011 06/10/20 1209  BP: 97/70 92/63  Pulse: 75 66  Resp:  18  Temp:  98.1 F (36.7 C)  SpO2:  100%   Exam:  Constitutional:  . The patient is awake, alert, and oriented x 3. No acute distress. Respiratory:  . No increased work of breathing. . No wheezes, rales, or rhonchi . No tactile fremitus Cardiovascular:  . Regular rate and rhythm . No murmurs, ectopy, or gallups. . No lateral PMI. No thrills. Abdomen:  . Abdomen is soft, non-tender, non-distended . No hernias, masses, or organomegaly . Normoactive bowel sounds.  Musculoskeletal:  . No cyanosis, clubbing, or edema Skin:  . No rashes, lesions, ulcers . palpation of skin: no induration or nodules Neurologic:  . CN 2-12 intact . Sensation all 4 extremities intact Psychiatric:  . Mental status o Mood, affect appropriate o Orientation to person, place, time  . judgment and insight appear intact  Discharge Instructions  Discharge Instructions    Activity as tolerated - No restrictions   Complete by: As directed    Avoid heights, operating heavy machinery, bathing or swimming alone.   Call MD for:   Complete by: As directed    Neurological changes.   Diet - low sodium heart healthy   Complete by: As directed    Discharge instructions   Complete by: As directed    Discharge to home. No driving until no seizures x 6 months. Follow up with PCP as arranged for by Transitions of Care  Team in 7-10 days Follow up with psychiatry as arranged for by Transitions of Care Team in 2-4 weeks.   Increase activity slowly   Complete by: As directed      Allergies as of 06/10/2020   Not on File     Medication List    STOP taking these medications   Benadryl Allergy 25 MG tablet Generic drug: diphenhydrAMINE   cyclobenzaprine 10 MG  tablet Commonly known as: FLEXERIL   phenazopyridine 100 MG tablet Commonly known as: PYRIDIUM     TAKE these medications   cholecalciferol 25 MCG (1000 UNIT) tablet Commonly known as: VITAMIN D3 Take 1,000 Units by mouth daily.   Melatonin 10 MG Tabs Take 1 tablet by mouth at bedtime as needed (for sleep).   propranolol 40 MG tablet Commonly known as: INDERAL Take 1 tablet (40 mg total) by mouth 2 (two) times daily. What changed: when to take this      Not on File  The results of significant diagnostics from this hospitalization (including imaging, microbiology, ancillary and laboratory) are listed below for reference.    Significant Diagnostic Studies: CT Head Wo Contrast  Result Date: 06/08/2020 CLINICAL DATA:  Seizure.  Now with headache. EXAM: CT HEAD WITHOUT CONTRAST TECHNIQUE: Contiguous axial images were obtained from the base of the skull through the vertex without intravenous contrast. COMPARISON:  None FINDINGS: Brain: No evidence of acute infarction, hemorrhage, hydrocephalus, extra-axial collection or mass lesion/mass effect. Vascular: No hyperdense vessel or unexpected calcification. Skull: Normal. Negative for fracture or focal lesion. Sinuses/Orbits: No acute finding. Other: None. IMPRESSION: No acute intracranial abnormalities. Normal brain. Electronically Signed   By: Signa Kellaylor  Stroud M.D.   On: 06/08/2020 07:59   MR BRAIN W WO CONTRAST  Result Date: 06/09/2020 CLINICAL DATA:  Seizure EXAM: MRI HEAD WITHOUT AND WITH CONTRAST TECHNIQUE: Multiplanar, multiecho pulse sequences of the brain and surrounding structures were obtained without and with intravenous contrast. CONTRAST:  7.715mL GADAVIST GADOBUTROL 1 MMOL/ML IV SOLN COMPARISON:  None. FINDINGS: Brain: There is no acute infarction or intracranial hemorrhage. There is no intracranial mass, mass effect, or edema. There is no hydrocephalus or extra-axial fluid collection. Ventricles and sulci are normal in size and  configuration. A few small foci no T2 hyperintensity in the supratentorial white matter likely reflect nonspecific gliosis/demyelination of doubtful clinical significance. No abnormal enhancement. Vascular: Major vessel flow voids at the skull base are preserved. Skull and upper cervical spine: Normal marrow signal is preserved. Sinuses/Orbits: Minor mucosal thickening.  Orbits are unremarkable. Other: Sella is unremarkable.  Mastoid air cells are clear. IMPRESSION: No significant abnormality. Electronically Signed   By: Guadlupe SpanishPraneil  Patel M.D.   On: 06/09/2020 07:01   Overnight EEG with video  Result Date: 06/09/2020 Charlsie QuestYadav, Priyanka O, MD     06/10/2020  9:52 AM Patient Name: Ernesta AmbleLotte Schafer MRN: [id number] Epilepsy Attending: Charlsie QuestPriyanka O Yadav Referring Physician/Provider: Dr Caryl PinaEric Lindzen Duration: 06/09/2020 [id number] to 06/10/2020 [id number] Patient history: 39yo F with seizure like episodes. EEG to evaluate for seizure. Level of alertness: Awake, asleep AEDs during EEG study: None Technical aspects: This EEG study was done with scalp electrodes positioned according to the 10-20 International system of electrode placement. Electrical activity was acquired at a sampling rate of 500Hz  and reviewed with a high frequency filter of 70Hz  and a low frequency filter of 1Hz . EEG data were recorded continuously and digitally stored. Description: The posterior dominant rhythm consists of 9 Hz activity of moderate voltage (25-35 uV) seen predominantly in  posterior head regions, symmetric and reactive to eye opening and eye closing. Sleep was characterized by vertex waves, sleep spindles (12 to 14 Hz), maximal frontocentral region.   Event button was pressed on 06/09/2020 at 1845 for eye flutter and lip twitching. Concomitant eeg change before, during and after the event didn't show any eeg change to suggest seizure Event button was pressed on 06/09/2020 at 1049, 1207, 1756 for unclear reasons. Concomitant eeg change before, during and after  the event didn't show any eeg change to suggest seizure IMPRESSION: This study is within normal limits. No seizures or epileptiform discharges were seen throughout the recording. Event button was pressed on 06/09/2020 for eyelid flutter and lip twitching without concomitant eeg change and was not epileptic Charlsie Quest    Microbiology: Recent Results (from the past 240 hour(s))  SARS Coronavirus 2 by RT PCR (hospital order, performed in [facility] hospital lab) Nasopharyngeal Nasopharyngeal Swab     Status: None   Collection Time: 06/08/20  8:27 AM   Specimen: Nasopharyngeal Swab  Result Value Ref Range Status   SARS Coronavirus 2 NEGATIVE NEGATIVE Final    Comment: (NOTE) SARS-CoV-2 target nucleic acids are NOT DETECTED.  The SARS-CoV-2 RNA is generally detectable in upper and lower respiratory specimens during the acute phase of infection. The lowest concentration of SARS-CoV-2 viral copies this assay can detect is 250 copies / mL. A negative result does not preclude SARS-CoV-2 infection and should not be used as the sole basis for treatment or other patient management decisions.  A negative result may occur with improper specimen collection / handling, submission of specimen other than nasopharyngeal swab, presence of viral mutation(s) within the areas targeted by this assay, and inadequate number of viral copies (<250 copies / mL). A negative result must be combined with clinical observations, patient history, and epidemiological information.  Fact Sheet for Patients:   BoilerBrush.com.cy  Fact Sheet for Healthcare Providers: https://pope.com/  This test is not yet approved or  cleared by the [place] FDA and has been authorized for detection and/or diagnosis of SARS-CoV-2 by FDA under an Emergency Use Authorization (EUA).  This EUA will remain in effect (meaning this test can be used) for the duration of the COVID-19  declaration under Section 564(b)(1) of the Act, 21 U.S.C. section 360bbb-3(b)(1), unless the authorization is terminated or revoked sooner.  Performed at [facility], [address], [place], [place] [address]   Urine culture     Status: Abnormal   Collection Time: 06/08/20 10:12 AM   Specimen: Urine, Clean Catch  Result Value Ref Range Status   Specimen Description   Final    URINE, CLEAN CATCH Performed at [facility], [address] [place], [place] [address]    Special Requests   Final    NONE Performed at [facility], [address], [place], [place] [address]    Culture MULTIPLE SPECIES PRESENT, SUGGEST RECOLLECTION (A)  Final   Report Status 06/09/2020 FINAL  Final     Labs: Basic Metabolic Panel: Recent Labs  Lab 06/08/20 0827  NA 138  K 4.2  CL 104  CO2 25  GLUCOSE 94  BUN 19  CREATININE 0.91  CALCIUM 9.0  MG 2.1   Liver Function Tests: Recent Labs  Lab 06/08/20 0827  AST 23  ALT 31  ALKPHOS 44  BILITOT 0.8  PROT 7.0  ALBUMIN 4.1   No results for input(s): LIPASE, AMYLASE in the last  168 hours. No results for input(s): AMMONIA in the last 168 hours. CBC: Recent Labs  Lab 06/08/20 0827  WBC 8.1  NEUTROABS 5.2  HGB 14.2  HCT 42.5  MCV 91.4  PLT 247   Cardiac Enzymes: No results for input(s): CKTOTAL, CKMB, CKMBINDEX, TROPONINI in the last 168 hours. BNP: BNP (last 3 results) No results for input(s): BNP in the last 8760 hours.  ProBNP (last 3 results) No results for input(s): PROBNP in the last 8760 hours.  CBG: Recent Labs  Lab 06/09/20 0912  GLUCAP 156*    Principal Problem:   Seizure (HCC) Active Problems:   History of pseudoseizure   Time coordinating discharge: 38 minutes.  Signed:        Rosco Harriott, DO [facility]  06/10/2020, 3:29 PM

## 2020-06-10 NOTE — Progress Notes (Signed)
PROGRESS NOTE  Melanie Allen CLE:[id number] DOB: [birth year] DOA: 06/08/2020 PCP: Patient, No Pcp Per  Brief History   Melanie Allen is a 39 y.o. female with medical history significant of seizures and prior history of pseudoseizures who presents to emergency department following multiple episodes of witnessed seizure events.  Patient noted to have 1 episode witnessed by family members yesterday and then 2 more episodes prior to arrival to ED on day of hospital visit.  Of note, patient was recently started on Lexapro prescribed by PCP.  Of note, patient is currently postictal, unable to provide her own history.  Patient's mother is at bedside  ED Course: In the emergency department, patient had a witnessed seizure event lasting approximately 2 minutes, resolved with Ativan.  CT head was performed and was found to be unremarkable.  EDP discussed case with neurology who recommended loading patient with Keppra.  Hospital service was consulted for consideration for medical admission.  Neurology has been consulted. The patient has had three witnessed episodes of seizure like activity since admission. vLTM EEG is in place.  Consultants  . Neurology  Procedures  . vLTM EEG  Interval History/Subjective  The patient is resting comfortably. EEG underway. Patient is sleeping and mother at bedside.  Objective   Vitals:  Vitals:   06/10/20 1011 06/10/20 1209  BP: 97/70 92/63  Pulse: 75 66  Resp:  18  Temp:  98.1 F (36.7 C)  SpO2:  100%   Exam:  Constitutional:  . The patient is awake, alert, and oriented x 3. No acute distress. Respiratory:  . No increased work of breathing. . No wheezes, rales, or rhonchi . No tactile fremitus Cardiovascular:  . Regular rate and rhythm . No murmurs, ectopy, or gallups. . No lateral PMI. No thrills. Abdomen:  . Abdomen is soft, non-tender, non-distended . No hernias, masses, or organomegaly . Normoactive bowel sounds.  Musculoskeletal:  . No  cyanosis, clubbing, or edema Skin:  . No rashes, lesions, ulcers . palpation of skin: no induration or nodules Neurologic:  . Unable to evaluate due to the patient's inability to cooperate with exam. Psychiatric:  . Unable to evaluate due to the patient's inability to cooperate with exam.  I have personally reviewed the following:   Today's Data  . Dispensing optician  . Urine culture: polymicrobial/contaminated . UA negative  Imaging  . CT head: No acute abnormalities. Marland Kitchen MRI Brain: No acute abnormalities . LTM EEG pending  Scheduled Meds: . ammonia  0.3 mL Inhalation Once  . enoxaparin (LOVENOX) injection  40 mg Subcutaneous Q24H  . propranolol  40 mg Oral BID   Continuous Infusions: . sodium chloride Stopped (06/08/20 2247)    Principal Problem:   Seizure (HCC) Active Problems:   History of pseudoseizure   LOS: 2 days   A & P  Witnessed seizures: Patient with total of 4 witnessed seizure prior to admission and three since. She has been loaded with keppra. CT head and MRI brain are negative for acute abnormality. LTM EEG underway.   History of pseudoseizures: Noted.   I have seen and examined this patient myself. I have spent 30 minutes in her evaluation and care.  Status is: Inpatient  Remains inpatient appropriate because:Ongoing diagnostic testing needed not appropriate for outpatient work up  Dispo: The patient is from: Home              Anticipated d/c is to: Home  Anticipated d/c date is: 1 day              Patient currently is not medically stable to d/c.  Melanie Sherrard, DO [facility] Direct contact: see [email or web address]  7PM-7AM contact night coverage as above 06/10/2020, 1:45 PM  LOS: 1 day

## 2020-06-10 NOTE — Progress Notes (Signed)
Pt refused bed time propranolol stating, "People gave me that medication four times today. I'm maxed out on it." RN educated patient that the medication had only been scanned once during day shift, so this is the second dose for today. Patient stated, "You can't rely on computers." Patient refused X 3.

## 2020-06-10 NOTE — Social Work (Signed)
CSW included psychiatrist referrals to patient's discharge summary. Also added [facility] to AVS for patient to follow-up.   Manfred Arch, LCSWA Clinical Social Work

## 2020-06-10 NOTE — Progress Notes (Signed)
Subjective: Per bedside RN, patient recently had a 4 min seizure like episode in which her upper lip, R hand/fingers and R foot were twitching. Patient's eyes were closed during this episode, which self resolved after approx 4 mins. When provider was in the room, patient appeared to be drowsy and in a post-ictal type of state. Her mental status and speech did improve by the end of the visit, and she was able to answer questions and participate in the exam. Mother in law and RN were at bedside.  Objective: Current vital signs: BP 92/63   Pulse 66   Temp 98.1 F (36.7 C) (Oral)   Resp 18   SpO2 100%  Vital signs in last 24 hours: Temp:  [97.6 F (36.4 C)-98.7 F (37.1 C)] 98.1 F (36.7 C) (08/29 1209) Pulse Rate:  [64-75] 66 (08/29 1209) Resp:  [15-18] 18 (08/29 1209) BP: (92-106)/(57-79) 92/63 (08/29 1209) SpO2:  [98 %-100 %] 100 % (08/29 1209)  Intake/Output from previous day: No intake/output data recorded. Intake/Output this shift: No intake/output data recorded. Nutritional status:  Diet Order            Diet regular Room service appropriate? Yes; Fluid consistency: Thin  Diet effective now                 Neurologic Exam: Ment: Drowsy, oriented x4. Speech clear, no signs of aphasia.  CN: Facial symmetry normal, EOMs intact, pupils reactive and equal bilat.  Motor: Able to follow commands and move all extremities equally.  Cerebellar: No gross ataxia  Follow up evaluation by Neurology attending: With patient and mother in law's consent, the "spot that triggers seizures" along her midline posterior neck that was discussed on Friday was massaged by the attending with 2 RNs present. Within approximately 10 seconds of initiating massage at this point, seizure-like activity occurred, first manifesting as bilateral foot rapid oscillatory horizontal movement, then non-rhythmic facial contractions that switched sides during the spell. Mother in law then took over massage of this  point and the above motor activity increased in intensity. Patient did not answer questions during the spell, and eyes were closed. Mother in law then asked if she could stop massaging the midline neck and patient was gently laid supine from her seated position in the bed. The foot movements and facial twitching then stopped and were replaced by eyes slightly open with globes supraducted such that the whites of patient's eyes were showing, in conjunction with fine eyelid twitching bilaterally. This latter activity then stopped and patient was able to sit up and answer questions within about 10 seconds. There was no tongue biting or urinary incontinence during the spell. No patient complaints following the spell.   Lab Results: Results for orders placed or performed during the hospital encounter of 06/08/20 (from the past 48 hour(s))  Glucose, capillary     Status: Abnormal   Collection Time: 06/09/20  9:12 AM  Result Value Ref Range   Glucose-Capillary 156 (H) 70 - 99 mg/dL    Comment: Glucose reference range applies only to samples taken after fasting for at least 8 hours.    Recent Results (from the past 240 hour(s))  SARS Coronavirus 2 by RT PCR (hospital order, performed in [facility] hospital lab) Nasopharyngeal Nasopharyngeal Swab     Status: None   Collection Time: 06/08/20  8:27 AM   Specimen: Nasopharyngeal Swab  Result Value Ref Range Status   SARS Coronavirus 2 NEGATIVE NEGATIVE Final    Comment: (  NOTE) SARS-CoV-2 target nucleic acids are NOT DETECTED.  The SARS-CoV-2 RNA is generally detectable in upper and lower respiratory specimens during the acute phase of infection. The lowest concentration of SARS-CoV-2 viral copies this assay can detect is 250 copies / mL. A negative result does not preclude SARS-CoV-2 infection and should not be used as the sole basis for treatment or other patient management decisions.  A negative result may occur with improper specimen collection /  handling, submission of specimen other than nasopharyngeal swab, presence of viral mutation(s) within the areas targeted by this assay, and inadequate number of viral copies (<250 copies / mL). A negative result must be combined with clinical observations, patient history, and epidemiological information.  Fact Sheet for Patients:   BoilerBrush.com.cy  Fact Sheet for Healthcare Providers: https://pope.com/  This test is not yet approved or  cleared by the [place] FDA and has been authorized for detection and/or diagnosis of SARS-CoV-2 by FDA under an Emergency Use Authorization (EUA).  This EUA will remain in effect (meaning this test can be used) for the duration of the COVID-19 declaration under Section 564(b)(1) of the Act, 21 U.S.C. section 360bbb-3(b)(1), unless the authorization is terminated or revoked sooner.  Performed at [facility], [address], [place], [place] [address]   Urine culture     Status: Abnormal   Collection Time: 06/08/20 10:12 AM   Specimen: Urine, Clean Catch  Result Value Ref Range Status   Specimen Description   Final    URINE, CLEAN CATCH Performed at [facility], [address] [place], [place] [address]    Special Requests   Final    NONE Performed at [facility], [address] [place], [place] [address]    Culture MULTIPLE SPECIES PRESENT, SUGGEST RECOLLECTION (A)  Final   Report Status 06/09/2020 FINAL  Final    Lipid Panel No results for input(s): CHOL, TRIG, HDL, CHOLHDL, VLDL, LDLCALC in the last 72 hours.  Studies/Results: MR BRAIN W WO CONTRAST  Result Date: 06/09/2020 CLINICAL DATA:  Seizure EXAM: MRI HEAD WITHOUT AND WITH CONTRAST TECHNIQUE: Multiplanar, multiecho pulse sequences of the brain and surrounding structures were obtained without and with intravenous contrast. CONTRAST:  7.17mL GADAVIST GADOBUTROL 1  MMOL/ML IV SOLN COMPARISON:  None. FINDINGS: Brain: There is no acute infarction or intracranial hemorrhage. There is no intracranial mass, mass effect, or edema. There is no hydrocephalus or extra-axial fluid collection. Ventricles and sulci are normal in size and configuration. A few small foci no T2 hyperintensity in the supratentorial white matter likely reflect nonspecific gliosis/demyelination of doubtful clinical significance. No abnormal enhancement. Vascular: Major vessel flow voids at the skull base are preserved. Skull and upper cervical spine: Normal marrow signal is preserved. Sinuses/Orbits: Minor mucosal thickening.  Orbits are unremarkable. Other: Sella is unremarkable.  Mastoid air cells are clear. IMPRESSION: No significant abnormality. Electronically Signed   By: Guadlupe Spanish M.D.   On: 06/09/2020 07:01   Overnight EEG with video  Result Date: 06/09/2020 Charlsie Quest, MD     06/10/2020  9:52 AM Patient Name: Monda Chastain MRN: [id number] Epilepsy Attending: Charlsie Quest Referring Physician/Provider: Dr Caryl Pina Duration: 06/09/2020 3532 to 06/10/2020 9924 Patient history: 39yo F with seizure like episodes. EEG to evaluate for seizure. Level of alertness: Awake, asleep AEDs during EEG study: None Technical aspects: This EEG study was done with scalp electrodes positioned according to the 10-20 International system of electrode placement. Electrical activity  was acquired at a sampling rate of 500Hz  and reviewed with a high frequency filter of 70Hz  and a low frequency filter of 1Hz . EEG data were recorded continuously and digitally stored. Description: The posterior dominant rhythm consists of 9 Hz activity of moderate voltage (25-35 uV) seen predominantly in posterior head regions, symmetric and reactive to eye opening and eye closing. Sleep was characterized by vertex waves, sleep spindles (12 to 14 Hz), maximal frontocentral region.   Event button was pressed on 06/09/2020 at 1845  for eye flutter and lip twitching. Concomitant eeg change before, during and after the event didn't show any eeg change to suggest seizure Event button was pressed on 06/09/2020 at 1049, 1207, 1756 for unclear reasons. Concomitant eeg change before, during and after the event didn't show any eeg change to suggest seizure IMPRESSION: This study is within normal limits. No seizures or epileptiform discharges were seen throughout the recording. Event button was pressed on 06/09/2020 for eyelid flutter and lip twitching without concomitant eeg change and was not epileptic Priyanka Annabelle Harman Yadav    Medications:  Scheduled: . ammonia  0.3 mL Inhalation Once  . enoxaparin (LOVENOX) injection  40 mg Subcutaneous Q24H  . propranolol  40 mg Oral BID   Continuous: . 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 She has not at this time established a PCP or neurologist. 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 Her first spell was at age 39 while watching a movie. She has seizures that can be precipitated by specific environmental and tactile stimuli.She has been on Keppra in the past but did not feel this was of any efficacy. Patient has had multiple seizures at home and while in the ED over the last few days. Upon arriving to the EDshe had a seizure-like episodeduringwhich she was able to talk throughout, follow commands, had eyes fluttering and tapping her right thumb and index finger with no postictal phase. Patient did not have any tongue biting or urinary incontinence. She states that she has had 2 MRIs in the past, one of which was described to her as being positive for "white spots" on the left side of her brain. The other MRI was described to her as normal. 1. Semiology of the seizure event precipitating presentation  to the ED is as follows: Sudden onset of left >right limb jerking lasting 4 minutes in conjunction with unawareness of environmental cues, followed by gradual decrease in amplitude and frequency, then postictal confusion and fatigue. The patient has no memory of the event, which was witnessed by family.  2. CT head: Normal.  3. Keppra 1000 mg IV x 1 [facility].Now off anticonvulsant medication.  4. Of note, she has had two of her "mild" spells while on LTM yesterday.  5. LTM report for the past 24 hours: This study is within normal limits. No seizures or epileptiform discharges were seen throughout the recording. Event button was pressed on 06/10/2020 as described above without concomitant EEG change and was not epileptic 6. MRI brain w/wo contrast- No significant abnormality. 7. See exam above for semiology of a triggered seizure-like spell on follow up attending evaluation. No electrographic correlate was seen by Dr. Melynda RippleYadav, who reviewed the EEG immediately after the spell. 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 Patient and family were informed of our  findings. All questions answered.  8. MRI brain was normal. Images were reviewed with patient and her mother in law. All questions answered.   Recommendations: --No indication for an anticonvulsant at this time.  --From a neurological standpoint, can be discharged home today. Will need outpatient Neurology and Psychology follow ups. May also need to see Psychiatry for consideration of pharmacological anxiolysis.  -- Stress reduction techniques that may help to reduce pseudoseizure frequency or eliminate pseudoseizures over time were discussed with the patient and her mother in law.  -The patient has been informed that she should not drive until seizure free for 6 months.  --If she should have an event that appears more consistent with an epileptic seizure, she should call 911 in order to be  reevaluated emergently. Signs to look for include, but are not restricted to, significant changes in breathing or ability to ventilate, pale or blue skin or mucosal membranes, foaming at the mouth, tongue biting, a spell lasting > 5 minutes, urinary incontinence and a spell resulting in a fall or other injury. Any other findings medically concerning to family or patient should also be medically evaluated.    LOS: 2 days   @Electronically  signed: Dr. 06/10/2020  3:06 PM

## 2020-06-10 NOTE — Progress Notes (Signed)
Patient being discharged home with self care. Education and information provided to patient and pt husband. IV has been removed. CCMD notified. Patient leaving unit via wheelchair.

## 2020-06-10 NOTE — Progress Notes (Signed)
LTM EEG discontinued - no skin breakdown at unhook.   

## 2021-08-05 IMAGING — MR MR HEAD WO/W CM
16 of 18 series · 42 of 48 positions shown · IV contrast (Gadavist)
Comparison: None.

CLINICAL DATA: Seizure

EXAM:
MRI HEAD WITHOUT AND WITH CONTRAST
TECHNIQUE: Multiplanar, multiecho pulse sequences of the brain and surrounding
structures were obtained without and with intravenous contrast.
CONTRAST:  7.5mL GADAVIST GADOBUTROL 1 MMOL/ML IV SOLN

[Series 9: DWI · axial · 3.0mm · 0.88mm/px · z∈[-76,+65]mm · 7 of 96 slices shown (1 of 4)]
[im 1/96]
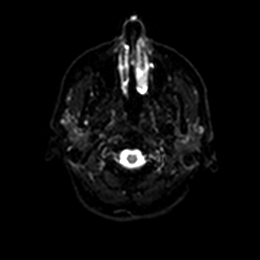
[im 16/96]
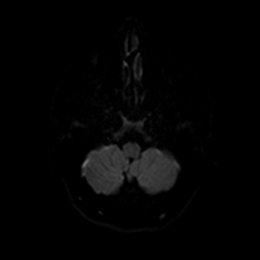
[im 32/96]
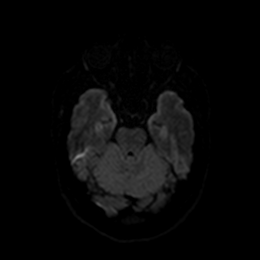
[im 48/96]
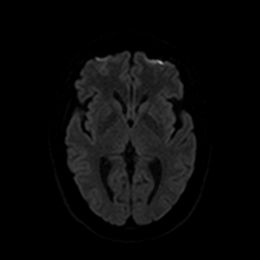
[im 64/96]
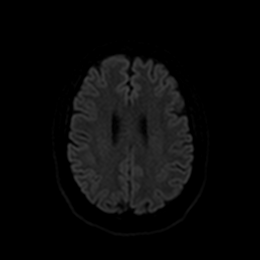
[im 80/96]
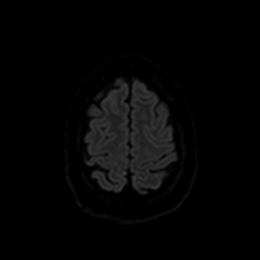
[im 96/96]
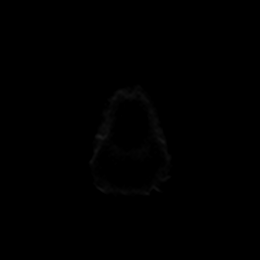

[Series 10: DWI · axial · 3.0mm · 0.88mm/px · z∈[-76,+65]mm · 3 of 48 slices shown (2 of 4)]
[im 1/48]
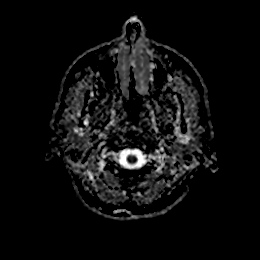
[im 24/48]
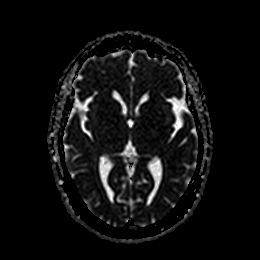
[im 48/48]
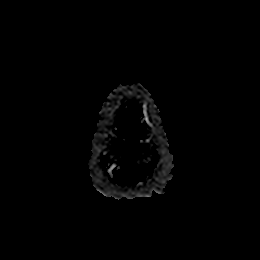

[Series 11: T1 · sagittal · 5.0mm · 0.75mm/px · 1 of 23 slices shown]
[im 1/23]
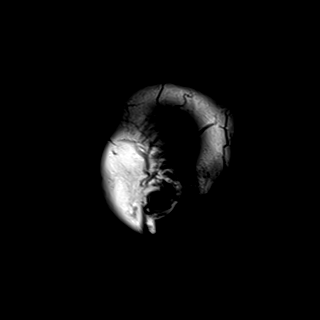

[Series 12: DWI · coronal · 4.0mm · 0.88mm/px · 4 of 66 slices shown (3 of 4)]
[im 1/66]
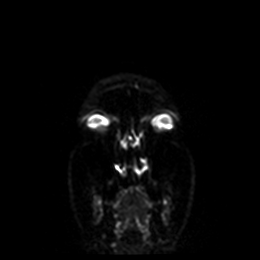
[im 22/66]
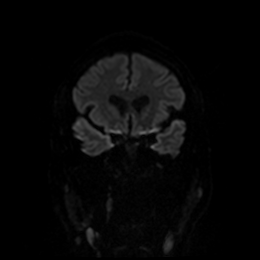
[im 44/66]
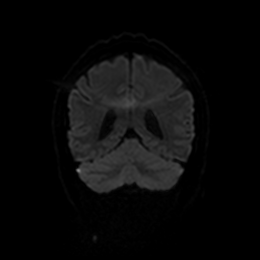
[im 66/66]
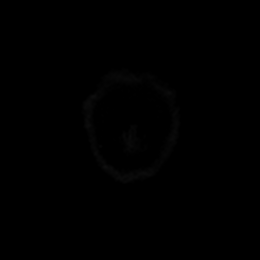

[Series 13: DWI · coronal · 4.0mm · 0.88mm/px · 2 of 33 slices shown (4 of 4)]
[im 1/33]
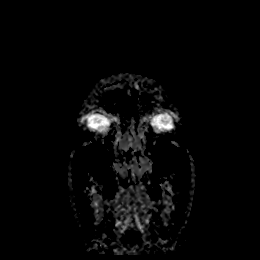
[im 33/33]
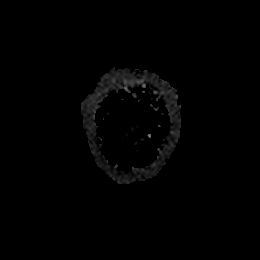

[Series 14: T2 · axial · 5.0mm · 0.66mm/px · z∈[-79,+65]mm · 2 of 25 slices shown (1 of 2)]
[im 1/25]
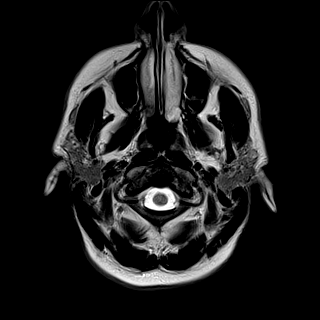
[im 25/25]
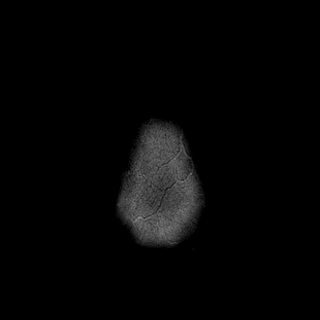

[Series 15: FLAIR · axial · 5.0mm · 0.82mm/px · z∈[-79,+65]mm · 2 of 25 slices shown (1 of 2)]
[im 1/25]
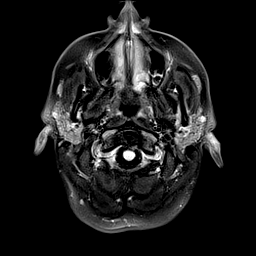
[im 25/25]
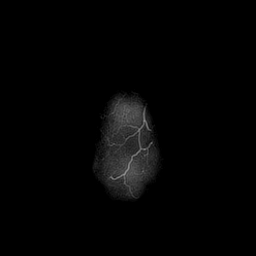

[Series 16: mag_images · axial · 3.0mm · 0.82mm/px · z∈[-84,+69]mm · 3 of 52 slices shown]
[im 1/52]
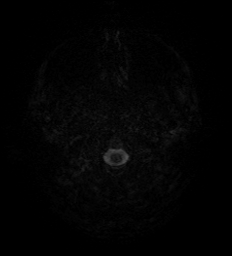
[im 26/52]
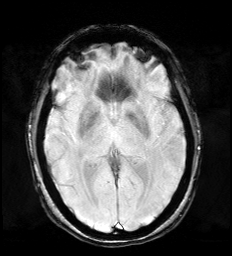
[im 52/52]
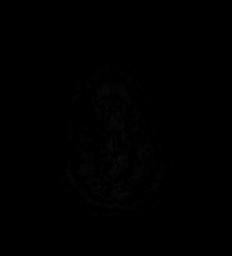

[Series 17: pha_images · axial · 3.0mm · 0.82mm/px · z∈[-84,+66]mm · 3 of 51 slices shown]
[im 1/51]
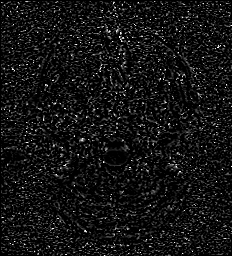
[im 26/51]
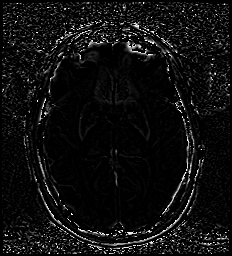
[im 51/51]
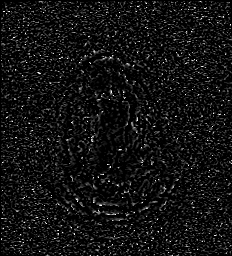

[Series 18: swi_images · axial · 3.0mm · 0.82mm/px · z∈[-84,+69]mm · 3 of 52 slices shown]
[im 1/52]
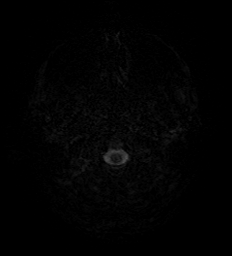
[im 26/52]
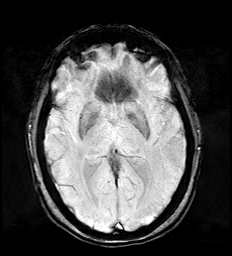
[im 52/52]
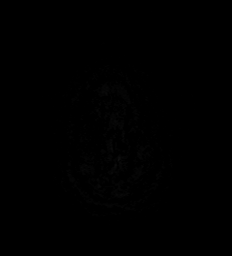

[Series 19: mip_images(sw) · axial · 24.0mm · 0.82mm/px · z∈[-73,+59]mm · 3 of 45 slices shown]
[im 1/45]
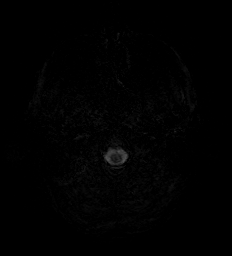
[im 23/45]
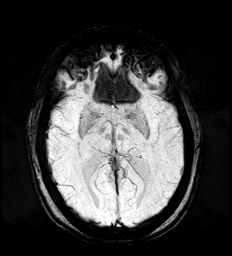
[im 45/45]
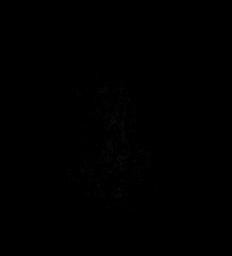

[Series 24: FLAIR · coronal · 3.0mm · 0.56mm/px · 2 of 32 slices shown (2 of 2)]
[im 1/32]
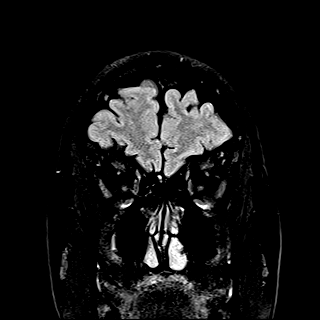
[im 32/32]
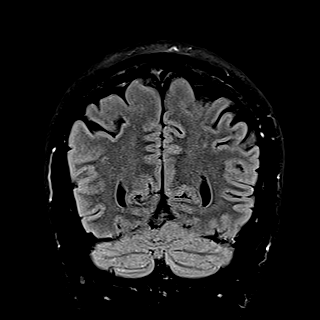

[Series 25: T2 · coronal · 3.0mm · 0.27mm/px · 2 of 32 slices shown (2 of 2)]
[im 1/32]
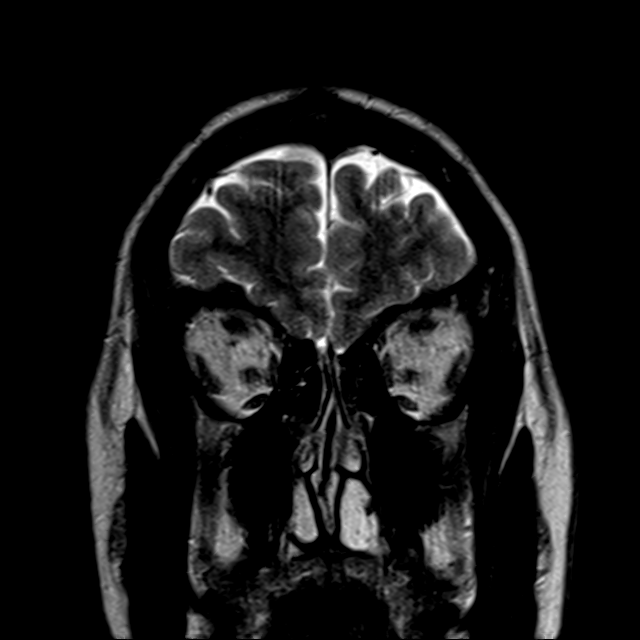
[im 32/32]
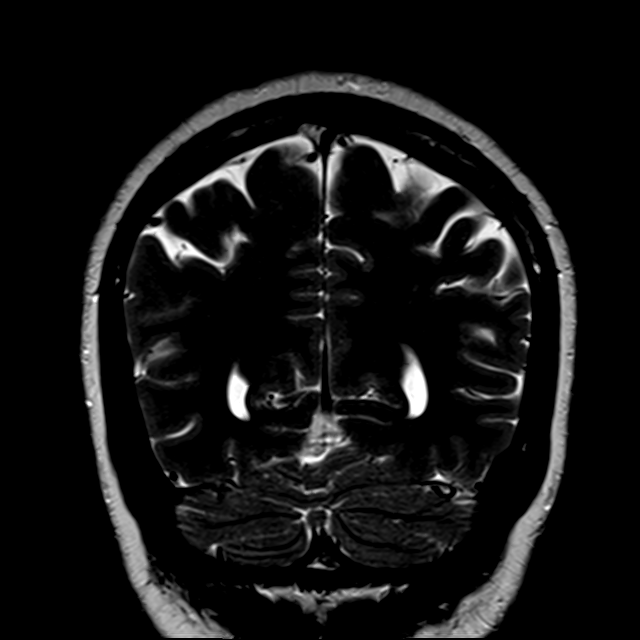

[Series 28: T2 post-contrast · coronal · 5.0mm · 0.72mm/px · 2 of 28 slices shown]
[im 1/28]
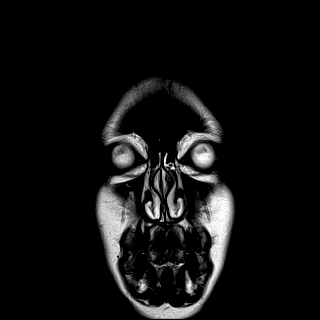
[im 28/28]
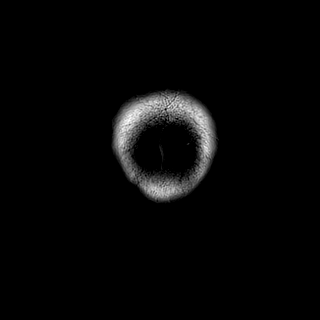

[Series 30: T1 post-contrast · coronal · 5.0mm · 0.34mm/px · 2 of 28 slices shown (1 of 2)]
[im 1/28]
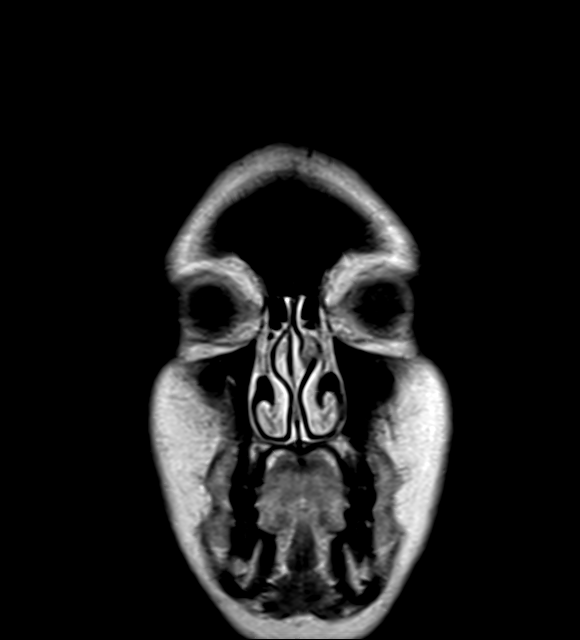
[im 28/28]
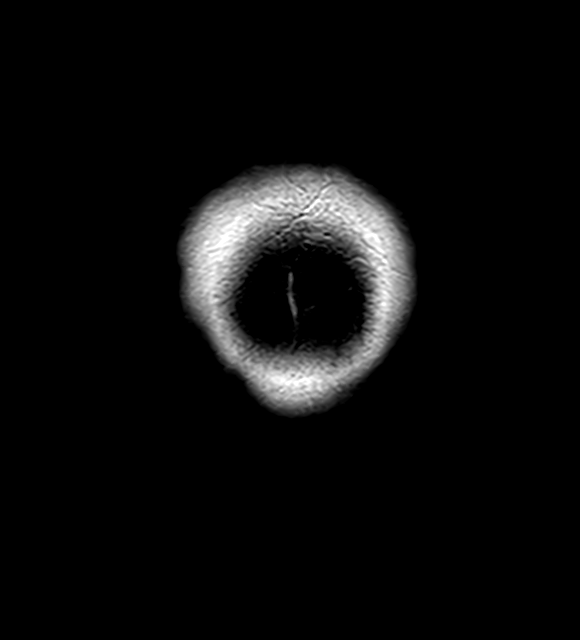

[Series 31: T1 post-contrast · sagittal · 5.0mm · 0.72mm/px · 1 of 23 slices shown (2 of 2)]
[im 1/23]
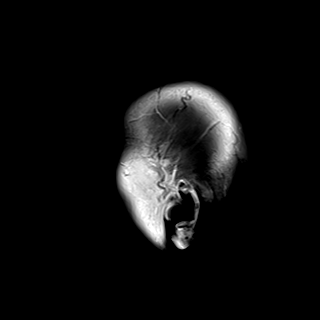

[42 of 48 positions shown; findings below may reference images not displayed]

FINDINGS: Brain: There is no acute infarction or intracranial hemorrhage.
There is no intracranial mass, mass effect, or edema. There is no
hydrocephalus or extra-axial fluid collection. Ventricles and sulci
are normal in size and configuration. A few small foci no T2
hyperintensity in the supratentorial white matter likely reflect
nonspecific gliosis/demyelination of doubtful clinical significance.
No abnormal enhancement.

Vascular: Major vessel flow voids at the skull base are preserved.

Skull and upper cervical spine: Normal marrow signal is preserved.

Sinuses/Orbits: Minor mucosal thickening.  Orbits are unremarkable.

Other: Sella is unremarkable.  Mastoid air cells are clear.
IMPRESSION: No significant abnormality.
